# Patient Record
Sex: Female | Born: 1985 | Hispanic: Yes | Smoking: Former smoker
Health system: Southern US, Community
[De-identification: ages and names within clinical notes are randomized; demographics above are authoritative.]

## PROBLEM LIST (undated history)

## (undated) ENCOUNTER — Inpatient Hospital Stay (HOSPITAL_COMMUNITY): Payer: Self-pay

## (undated) DIAGNOSIS — E785 Hyperlipidemia, unspecified: Secondary | ICD-10-CM

## (undated) DIAGNOSIS — N159 Renal tubulo-interstitial disease, unspecified: Secondary | ICD-10-CM

## (undated) HISTORY — PX: NO PAST SURGERIES: SHX2092

---

## 2003-04-07 ENCOUNTER — Emergency Department (HOSPITAL_COMMUNITY): Admission: EM | Admit: 2003-04-07 | Discharge: 2003-04-07 | Payer: Self-pay | Admitting: Emergency Medicine

## 2004-03-09 ENCOUNTER — Inpatient Hospital Stay (HOSPITAL_COMMUNITY): Admission: AD | Admit: 2004-03-09 | Discharge: 2004-03-09 | Payer: Self-pay | Admitting: Obstetrics and Gynecology

## 2004-09-20 ENCOUNTER — Ambulatory Visit: Payer: Self-pay | Admitting: Obstetrics and Gynecology

## 2004-10-13 ENCOUNTER — Ambulatory Visit: Payer: Self-pay | Admitting: *Deleted

## 2005-02-09 ENCOUNTER — Ambulatory Visit: Payer: Self-pay | Admitting: *Deleted

## 2005-02-16 ENCOUNTER — Ambulatory Visit: Payer: Self-pay | Admitting: Obstetrics and Gynecology

## 2005-02-23 ENCOUNTER — Encounter: Payer: Self-pay | Admitting: Obstetrics and Gynecology

## 2005-02-23 ENCOUNTER — Ambulatory Visit: Payer: Self-pay | Admitting: Obstetrics and Gynecology

## 2005-02-28 ENCOUNTER — Inpatient Hospital Stay (HOSPITAL_COMMUNITY): Admission: AD | Admit: 2005-02-28 | Discharge: 2005-02-28 | Payer: Self-pay | Admitting: Obstetrics & Gynecology

## 2005-04-27 ENCOUNTER — Ambulatory Visit: Payer: Self-pay | Admitting: Obstetrics and Gynecology

## 2005-08-23 ENCOUNTER — Ambulatory Visit: Payer: Self-pay | Admitting: Family Medicine

## 2005-08-28 ENCOUNTER — Ambulatory Visit (HOSPITAL_COMMUNITY): Admission: RE | Admit: 2005-08-28 | Discharge: 2005-08-28 | Payer: Self-pay | Admitting: Family Medicine

## 2005-10-04 ENCOUNTER — Ambulatory Visit: Payer: Self-pay | Admitting: Gynecology

## 2009-02-09 ENCOUNTER — Inpatient Hospital Stay (HOSPITAL_COMMUNITY): Admission: AD | Admit: 2009-02-09 | Discharge: 2009-02-09 | Payer: Self-pay | Admitting: Obstetrics and Gynecology

## 2009-05-06 ENCOUNTER — Ambulatory Visit (HOSPITAL_COMMUNITY): Admission: RE | Admit: 2009-05-06 | Discharge: 2009-05-06 | Payer: Self-pay | Admitting: Family Medicine

## 2009-07-01 ENCOUNTER — Encounter: Payer: Self-pay | Admitting: Family Medicine

## 2009-07-01 ENCOUNTER — Ambulatory Visit (HOSPITAL_COMMUNITY): Admission: RE | Admit: 2009-07-01 | Discharge: 2009-07-01 | Payer: Self-pay | Admitting: Family Medicine

## 2009-09-19 ENCOUNTER — Ambulatory Visit: Payer: Self-pay | Admitting: Advanced Practice Midwife

## 2010-02-10 ENCOUNTER — Inpatient Hospital Stay (HOSPITAL_COMMUNITY): Admission: AD | Admit: 2010-02-10 | Discharge: 2009-09-20 | Payer: Self-pay | Admitting: Family Medicine

## 2010-03-27 ENCOUNTER — Encounter: Payer: Self-pay | Admitting: Family Medicine

## 2010-05-21 LAB — CBC
HCT: 34.5 % — ABNORMAL LOW (ref 36.0–46.0)
Hemoglobin: 11.4 g/dL — ABNORMAL LOW (ref 12.0–15.0)
MCV: 82.9 fL (ref 78.0–100.0)
Platelets: 227 10*3/uL (ref 150–400)
RBC: 4.16 MIL/uL (ref 3.87–5.11)
WBC: 12.4 10*3/uL — ABNORMAL HIGH (ref 4.0–10.5)

## 2010-06-07 LAB — URINE CULTURE: Colony Count: >=100000

## 2010-06-07 LAB — URINALYSIS, ROUTINE W REFLEX MICROSCOPIC
Specific Gravity, Urine: 1.015 (ref 1.005–1.030)
Urobilinogen, UA: 1 mg/dL (ref 0.0–1.0)

## 2010-06-07 LAB — CBC
Hemoglobin: 11.8 g/dL — ABNORMAL LOW (ref 12.0–15.0)
RBC: 3.7 MIL/uL — ABNORMAL LOW (ref 3.87–5.11)

## 2010-06-07 LAB — URINE MICROSCOPIC-ADD ON

## 2010-06-07 LAB — WET PREP, GENITAL

## 2010-06-07 LAB — GC/CHLAMYDIA PROBE AMP, GENITAL: GC Probe Amp, Genital: NEGATIVE

## 2010-07-22 NOTE — Group Therapy Note (Signed)
Laura [name], SANTARELLI NO.:  [id number]   MEDICAL RECORD NO.:  [id number]          PATIENT TYPE:  WOC   LOCATION:  [facility]                   FACILITY:  [facility]   PHYSICIAN:  Montey Hora, M.D.    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  The patient is here today for a follow up Pap smear.  She also complains of some lower abdominal pain and some mid epigastric  pain. The mid epigastric pain is worse with acid or fatty foods.  She has  not taken any medicine to try to alleviate it.  Her abdominal pain is mild  all over her abdomen, worse if she bends forward and stands back up although  brief, it is fairly intense.  Denies any other problems. She does have some  breast tenderness.   PAST MEDICAL HISTORY:  Unremarkable.   MEDICATIONS:  None.   ALLERGIES:  None.   PHYSICAL EXAMINATION:  VITAL SIGNS:  Temperature is 98.1, pulse is 68, blood  pressure is 109/64 and weight is 138.5.  GENERAL:  Well-developed, well-nourished, in no acute distress.  HEART:  Regular rate and rhythm without murmur.  LUNGS:  Clear to auscultation. Normal respiratory effort.  ABDOMEN:  Soft, bowel sounds are present, there is very mild tenderness to  palpation in bilateral lower quadrants, also in the left upper quadrant.  PELVIC EXAM:  Shows normal external genitalia, normal vaginal mucosa, normal  nulliparous cervix. Uterus feels mildly enlarged, a little bit of tenderness  over the left adnexa. Right adnexa is normal size and no tenderness.   Urine pregnancy test is negative.   ASSESSMENT AND PLAN:  1.  Abnormal Pap, S/P cryotherapy in August, 2006. Repeat Pap was done      today.  2.  Amenorrhea.  Urine pregnancy today is negative. Have asked her to   recheck in 2 weeks if no period by that time.  3.  Abdominal pain, mildly enlarged uterus. Will check a pelvic ultrasound.  4.  Gastroesophageal reflux disease. Offered patient Tums, Zantac or      Prilosec, all over-the-counter.   Desire for pregnancy - advised multivitamin. Follow up if no pregnancy  results within a year of the onset of trying for pregnancy.           ______________________________  Montey Hora, M.D.     KR/MEDQ  D:  08/23/2005  T:  08/23/2005  Job:  [id number]

## 2010-07-22 NOTE — Group Therapy Note (Signed)
Laura [name], LEUGERS NO.:  [id number]   MEDICAL RECORD NO.:  [id number]          PATIENT TYPE:  WOC   LOCATION:  [facility]                   FACILITY:  [facility]   PHYSICIAN:  Argentina Donovan, MD        DATE OF BIRTH:  [birth year]   DATE OF SERVICE:  09/20/2004                                    CLINIC NOTE   CHIEF COMPLAINT:  Told she had an abnormal Pap smear.   The patient is a 25 year old nulligravida Hispanic female who speaks some  English who has an insignificant past history until she had an abnormal Pap  smear. Colposcopy was done and showed CIN-2, severe dysplasia. I had a long  discussion with the patient including HPV as well as using condoms and the  procedures we would do. We have decided to do cryosurgery on this patient.  She will come back for another appointment.   ALLERGIES:  None.   REGULAR MEDICATIONS:  None.   FAMILY HISTORY:  Noncontributory.   REVIEW OF SYSTEMS:  Negative with the exception of present illness.   PHYSICAL EXAMINATION:  VITAL SIGNS:  Blood pressure 111/68, pulse 79 per  minute, temperature 98, respirations 18 per minute. The patient is 142  pounds and 5 feet 4 inches.  HEENT:  Within normal limits.  LUNGS:  Clear to auscultation and percussion.  HEART:  No murmur, normal sinus rhythm.  ABDOMEN:  Soft and nontender with no mass, no organomegaly.  PELVIC:  External genitalia normal, BUS within normal limits. The vagina is  clean and well-rugated. The cervix is clean and nulliparous. Uterus  anterior; normal size, shape, and consistency; adnexa normal.  EXTREMITIES:  Negative.   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 Scheduled cryosurgery.       PR/MEDQ  D:  09/20/2004  T:  09/20/2004  Job:  [id number]

## 2010-07-22 NOTE — Group Therapy Note (Signed)
[name], [name] NO.:  [id number]   MEDICAL RECORD NO.:  [id number]          PATIENT TYPE:  WOC   LOCATION:  [facility]                   FACILITY:  [facility]   PHYSICIAN:  Ginger Carne, MD DATE OF BIRTH:  July 04, 1985   DATE OF SERVICE:  10/04/2005                                    CLINIC NOTE   This is a followup visit over 25 year old nulliparous Hispanic female who  has irregular menses.  She was recently seen on August 23, 2005, by Dr. Dimple Casey,  and the reader is referred to this note.  The patient complains of a right  lateral breast discomfort without any palpable masses.  The pain has  occurred over the past several weeks.  Her discomfort that she describes in  the pelvis is no different than what she has experienced in the past. Pelvic  sonogram obtained recently August 28, 2005, was normal.   SALIENT PHYSICAL FINDINGS:  Both breasts examined without dominant masses,  discharge, thickenings or tenderness.  Supraclavicular nodes are negative.  Axilla negative.  Abdomen is soft without gross hepatosplenomegaly.   IMPRESSION:  1.  Irregular menses with occasional pelvic pain.  2.  Right breast pain.   PLAN:  The patient understands that, in order to control her menses with a  presumptive diagnosis of PCOS, that she would need to be on oral  contraceptives.  Since she is desirous for birth control and is unable to  afford fertility management, the patient was asked to think about what she  would like to do.   In view of no physical findings present on breast examination and her young  age and the patient's concurrence that she does not palpate a mass, she was  asked to observe this area for further pain. If it continues over the next 4-  6 weeks, I asked her to return, and we will proceed with a breast sonogram  and possible mammogram.           ______________________________  Ginger Carne, MD     SHB/MEDQ  D:  10/04/2005  T:  10/04/2005   Job:  [id number]

## 2010-07-22 NOTE — Group Therapy Note (Signed)
[name], [name] NO.:  [id number]   MEDICAL RECORD NO.:  [id number]          PATIENT TYPE:  WOC   LOCATION:  [facility]                   FACILITY:  [facility]   PHYSICIAN:  Argentina Donovan, MD        DATE OF BIRTH:  [birth year]   DATE OF SERVICE:                                    CLINIC NOTE   HISTORY OF PRESENT ILLNESS:  The patient is a 25 year old nulligravida  Hispanic female who speaks English, and was with a history of CIN 2 and  severe dysplasia who underwent cryosurgery, and has been followed with Pap  smears, her last being in December which was normal, and she is due for  another Pap smear in June.  Her chief complaint today was the onset of  abrupt periumbilical pain that radiated down into the pelvis starting 2  weeks prior to this visit with a last menstrual period being March 30, 2005.  This pain is steady.  It was abrupt in onset.  It is worse with  intercourse, and is accompanied by a bilateral frontal headache that has  lasted since the pain began.  Today, it seems to be getting better, and we  should be expecting her period at any time.  However, she has been having  unprotected sex, and interestingly enough, in her history, when she had the  cryosurgery, she was amenorrheic for 4 months after that and then had a  period where she bled for a month.   PHYSICAL EXAMINATION:  ABDOMEN:  Soft, flat, nontender.  No masses.  No  organomegaly.  Patient indicates the area of the pain, but she has no  guarding, rebound or obvious physical findings of pain.  GENITOURINARY:  The external genitalia is normal.  BUS within normal limits.  The vagina is clean.  The cervix is clean and nulliparous, and the uterus is  easily palpated anterior, and normal size, shape and consistency.  Both  adnexa could be easily palpated with normal pain to compression.   DISCUSSION:  The patient is anxious to use birth control.  We have told her  she may have had an episode of  mittelschmerz.  We are going to await the  onset of her next period, and see whether the headaches and the pain  resolves.  If it does not, further testing, i.e. ultrasound, may have to be  done.  The patient is being a given a prescription for Ortho Tri-Cyclen Lo  which she is going to start after the onset of her next period, and she is  to return if the pain and/or the headaches continue.   IMPRESSION:  Pelvic pain accompanied by continual headache since onset,  probable mittelschmerz, possible ruptured ovarian cyst.           ______________________________  Argentina Donovan, MD     PR/MEDQ  D:  04/27/2005  T:  04/27/2005  Job:  [id number]

## 2010-07-22 NOTE — Group Therapy Note (Signed)
NAMESELISA, TENSLEY NO.:  [id number]   MEDICAL RECORD NO.:  [id number]          PATIENT TYPE:  WOC   LOCATION[facility]                   FACILITY:  [facility]   PHYSICIAN:  Carolanne Grumbling, M.D.   DATE OF BIRTH:  Jul 03, 1985   DATE OF SERVICE:                                    CLINIC NOTE   PROCEDURE NOTE   PREOPERATIVE DIAGNOSIS:  Cervical dysplasia (CIN-2, severe dysplasia).   POSTOPERATIVE DIAGNOSIS:  Cervical dysplasia (CIN-2, severe dysplasia).   PROCEDURE:  Cryotherapy.   SURGEON:  Carolanne Grumbling, M.D.   ASSISTANT:  Argentina Donovan, M.D.   ESTIMATED BLOOD LOSS:  None.   ANESTHESIA:  None.   DESCRIPTION OF PROCEDURE:  The patient is a 25 year old nulligravida  Hispanic female, who has a history of CIN-2, severe dysplasia.  Please see  office note from 09/20/04 full details.   The patient was put in stirrups.  A speculum was placed with good  visualization of the cervix.  There was gel placed on the tip of the cryo  machine and it was placed on her cervix.  It was frozen for three minutes,  then allowed to have a thaw cycle of five minutes and then frozen again for  an additional three minutes.  The patient tolerated the procedure well and  there were no complications.   The patient is to return to clinic in four months for repeat Pap smear.  She  was counseled on the fact that she will have some foul smelling vaginal  discharge, but that should improve over the next week or two.  She is to  abstain for any thing per vagina x3 weeks.       TW/MEDQ  D:  10/13/2004  T:  10/14/2004  Job:  [id number]

## 2010-07-22 NOTE — Group Therapy Note (Signed)
NAMERYAH, CRIBB NO.:  [id number]   MEDICAL RECORD NO.:  [id number]          PATIENT TYPE:  WOC   LOCATION:  [facility]                   [facility]ACILITY:  [facility]   PHYSICIAN:  Argentina Donovan, MD        DATE OF BIRTH:  1985/11/13   DATE OF SERVICE:                                    CLINIC NOTE   The patient here with a chief complaint of follow-up Pap smear for cervical  dysplasia type 2.   The patient is a 25 year old nulligravida Hispanic female, who speaks some  English and has an insignificant past history until she had an abnormal Pap  smear.  Colposcopy was followed and done, which showed a CIN 2 and severe  dysplasia.  The patient was here today to have a follow-up Pap smear.   ALLERGIES:  NONE.   REGULAR MEDICATIONS:  None.   FAMILY HISTORY:  Noncontributory.   REVIEW OF SYSTEMS:  Negative with the exception of present illness.   PHYSICAL EXAMINATION:  VITAL SIGNS:  Pulse 86, blood pressure 108/71, weight  138.3, height 5 feet 2 inches.  HEENT:  Within normal limits.  LUNGS:  Clear to auscultation.  HEART:  No murmur, normal sinus rhythm.  ABDOMEN:  Soft, non-tender with no mass, no organomegaly.  PELVIC:  Pelvic exam and a Pap smear was done.  External genitalia was  normal.  BUS normal within limits.  The vagina was clean, well rugated, no  lesions, no excoriations.  Cervix was clean, nulliparous.  Uterus anterior  on bimanual exam.  Adnexa were not palpated.  Pap smear was done and samples  were sent for follow-up on cervical dysplasia.   PLAN:  Follow-up on the results.           ______________________________  Argentina Donovan, MD     PR/MEDQ  D:  02/23/2005  T:  02/27/2005  Job:  [phone] [id number]

## 2011-02-24 ENCOUNTER — Emergency Department (HOSPITAL_BASED_OUTPATIENT_CLINIC_OR_DEPARTMENT_OTHER)
Admission: EM | Admit: 2011-02-24 | Discharge: 2011-02-24 | Disposition: A | Discharge status: Home / Self Care | Payer: Worker's Compensation | Attending: Emergency Medicine | Admitting: Emergency Medicine

## 2011-02-24 DIAGNOSIS — W261XXA Contact with sword or dagger, initial encounter: Secondary | ICD-10-CM | POA: Insufficient documentation

## 2011-02-24 DIAGNOSIS — Y9229 Other specified public building as the place of occurrence of the external cause: Secondary | ICD-10-CM | POA: Insufficient documentation

## 2011-02-24 DIAGNOSIS — Y93G1 Activity, food preparation and clean up: Secondary | ICD-10-CM | POA: Insufficient documentation

## 2011-02-24 DIAGNOSIS — S6992XA Unspecified injury of left wrist, hand and finger(s), initial encounter: Secondary | ICD-10-CM

## 2011-02-24 DIAGNOSIS — S61209A Unspecified open wound of unspecified finger without damage to nail, initial encounter: Secondary | ICD-10-CM | POA: Insufficient documentation

## 2011-02-24 DIAGNOSIS — W260XXA Contact with knife, initial encounter: Secondary | ICD-10-CM | POA: Insufficient documentation

## 2011-02-24 MED ORDER — TETANUS-DIPHTH-ACELL PERTUSSIS 5-2.5-18.5 LF-MCG/0.5 IM SUSP
0.5000 mL | Freq: Once | INTRAMUSCULAR | Status: AC
  Administered 2011-02-24: 0.5 mL via INTRAMUSCULAR
  Filled 2011-02-24: qty 0.5

## 2011-02-24 MED ORDER — BENZOCAINE 20 % MT SOLN
OROMUCOSAL | Status: AC
  Filled 2011-02-24: qty 57

## 2011-02-24 NOTE — ED Provider Notes (Signed)
History     CSN: [id number]  Arrival date & time 02/24/11  2034   First MD Initiated Contact with Patient 02/24/11 2200      Chief Complaint  Patient presents with  . Laceration    left thumb    (Consider location/radiation/quality/duration/timing/severity/associated sxs/prior treatment) HPI 25 year old otherwise healthy female presents with wound to left thumb while at work. 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 The knife did not penetrate through the glove. She does not know when her last tetanus shot was.  History reviewed. No pertinent past medical history.  History reviewed. No pertinent past surgical history.  History reviewed. No pertinent family history.  History  Substance Use Topics  . Smoking status: Current Some Day Smoker    Types: Cigarettes  . Smokeless tobacco: Never Used  . Alcohol Use: No    OB History    Grav Para Term Preterm Abortions TAB SAB Ect Mult Living                  Review of Systems  Allergies  Review of patient's allergies indicates no known allergies.  Home Medications  No current outpatient prescriptions on file.  BP 111/74  Pulse 81  Temp(Src) 98.2 F (36.8 C) (Oral)  Resp 16  Ht 5\' 2"  (1.575 m)  Wt 150 lb (68.04 kg)  BMI 27.44 kg/m2  SpO2 96%  LMP 02/19/2011  Physical Exam Gen: NAD, young, well nourished L thumb: disruption of the distal 3mm of L thumbnail from lateral to medial with ~20% of nail still attached.  No visible laceration to thumb tissue.  Good ROM, full strength, good capillary refill.  No foreign body identified. ED Course  Procedures (including critical care time)  Thumb was irrigated with copious warm water.  Dermabond was applied to nail disruption.  Labs Reviewed - No data to display No results found.   No diagnosis found.    MDM  25 year old who sustained an injury to the left thumb while  cutting chicken at work. There is breakage of the distal 3 mm of the thumbnail, with no visible laceration to the thumb itself. The majority of the distal thumbnail has been separated from the nailbed with resultant bleeding, but the medial portion of the nail remains attached, keeping the dislodged portion in relative anatomic position. The thumb itself appears functionally intact. The wound was irrigated, examined for foreign body, and then dermabond was applied. The patient received a tetanus shot. And can return to work normally, as she is right-handed. She can take ibuprofen for pain relief.        Kathreen Cosier, MD 02/24/11 2212

## 2011-02-24 NOTE — ED Notes (Addendum)
Pt states that she cut her left thumb while slicing chicken tonight at work.  Pt states that she does not know when her last tetanus shot was, unsure about need for UDS.  Occurred at work, works at [employer].  Wound clotted, no bleeding at this time.  In nad

## 2011-02-25 NOTE — ED Provider Notes (Signed)
I saw and evaluated the patient, reviewed the resident's note and I agree with the findings and plan.  The patient has a small injury to her distal nail without injury to the distal finger.  Dermabond applied to keep the nail down although there may be a very small injury to the underlying nail bed this would be very insignificant and is not necessary to remove the nail just to evaluate.  I suspect that will cause more bleeding and pain, and won't lead to improvement in evaluation or symptoms  Lyanne Co, MD 02/25/11 0100

## 2011-06-25 ENCOUNTER — Emergency Department (HOSPITAL_BASED_OUTPATIENT_CLINIC_OR_DEPARTMENT_OTHER)
Admission: EM | Admit: 2011-06-25 | Discharge: 2011-06-25 | Disposition: A | Discharge status: Home / Self Care | Payer: Self-pay | Attending: Emergency Medicine | Admitting: Emergency Medicine

## 2011-06-25 ENCOUNTER — Encounter (HOSPITAL_BASED_OUTPATIENT_CLINIC_OR_DEPARTMENT_OTHER): Payer: Self-pay | Admitting: Emergency Medicine

## 2011-06-25 DIAGNOSIS — R1013 Epigastric pain: Secondary | ICD-10-CM | POA: Insufficient documentation

## 2011-06-25 DIAGNOSIS — F17200 Nicotine dependence, unspecified, uncomplicated: Secondary | ICD-10-CM | POA: Insufficient documentation

## 2011-06-25 DIAGNOSIS — K2970 Gastritis, unspecified, without bleeding: Secondary | ICD-10-CM | POA: Insufficient documentation

## 2011-06-25 DIAGNOSIS — R110 Nausea: Secondary | ICD-10-CM | POA: Insufficient documentation

## 2011-06-25 DIAGNOSIS — K219 Gastro-esophageal reflux disease without esophagitis: Secondary | ICD-10-CM | POA: Insufficient documentation

## 2011-06-25 LAB — DIFFERENTIAL
Eosinophils Absolute: 0.1 10*3/uL (ref 0.0–0.7)
Eosinophils Relative: 1 % (ref 0–5)
Lymphocytes Relative: 36 % (ref 12–46)
Lymphs Abs: 3.4 10*3/uL (ref 0.7–4.0)
Monocytes Relative: 5 % (ref 3–12)

## 2011-06-25 LAB — URINALYSIS, ROUTINE W REFLEX MICROSCOPIC
Bilirubin Urine: NEGATIVE
Ketones, ur: NEGATIVE mg/dL
Nitrite: NEGATIVE
Urobilinogen, UA: 0.2 mg/dL (ref 0.0–1.0)

## 2011-06-25 LAB — CBC
Hemoglobin: 13.4 g/dL (ref 12.0–15.0)
MCH: 32.1 pg (ref 26.0–34.0)
MCV: 90.2 fL (ref 78.0–100.0)
Platelets: 233 10*3/uL (ref 150–400)
RBC: 4.18 MIL/uL (ref 3.87–5.11)
WBC: 9.6 10*3/uL (ref 4.0–10.5)

## 2011-06-25 LAB — COMPREHENSIVE METABOLIC PANEL
ALT: 13 U/L (ref 0–35)
Alkaline Phosphatase: 58 U/L (ref 39–117)
BUN: 14 mg/dL (ref 6–23)
CO2: 25 mEq/L (ref 19–32)
Calcium: 9.6 mg/dL (ref 8.4–10.5)
GFR calc Af Amer: >90 mL/min (ref >90)
GFR calc non Af Amer: >90 mL/min (ref >90)
Glucose, Bld: 90 mg/dL (ref 70–99)
Potassium: 3.8 mEq/L (ref 3.5–5.1)
Sodium: 138 mEq/L (ref 135–145)

## 2011-06-25 LAB — LIPASE, BLOOD: Lipase: 55 U/L (ref 11–59)

## 2011-06-25 MED ORDER — PANTOPRAZOLE SODIUM 40 MG PO TBEC
40.0000 mg | DELAYED_RELEASE_TABLET | Freq: Once | ORAL | Status: AC
  Administered 2011-06-25: 40 mg via ORAL
  Filled 2011-06-25: qty 1

## 2011-06-25 MED ORDER — ONDANSETRON HCL 4 MG PO TABS: 4.0000 mg | ORAL_TABLET | Freq: Four times a day (QID) | ORAL | Status: AC

## 2011-06-25 MED ORDER — GI COCKTAIL ~~LOC~~
30.0000 mL | Freq: Once | ORAL | Status: AC
  Administered 2011-06-25: 30 mL via ORAL
  Filled 2011-06-25: qty 30

## 2011-06-25 MED ORDER — LANSOPRAZOLE 30 MG PO CPDR: 30.0000 mg | DELAYED_RELEASE_CAPSULE | Freq: Every day | ORAL | Status: DC

## 2011-06-25 MED ORDER — ONDANSETRON 4 MG PO TBDP
4.0000 mg | ORAL_TABLET | Freq: Once | ORAL | Status: AC
  Administered 2011-06-25: 4 mg via ORAL
  Filled 2011-06-25: qty 1

## 2011-06-25 NOTE — Discharge Instructions (Signed)
Diet for GERD or PUD Nutrition therapy can help ease the discomfort of gastroesophageal reflux disease (GERD) and peptic ulcer disease (PUD).  HOME CARE INSTRUCTIONS   Eat your meals slowly, in a relaxed setting.   Eat 5 to 6 small meals per day.   If a food causes distress, stop eating it for a period of time.  FOODS TO AVOID  Coffee, regular or decaffeinated.   Cola beverages, regular or low calorie.   Tea, regular or decaffeinated.   Pepper.   Cocoa.   High fat foods, including meats.   Butter, margarine, hydrogenated oil (trans fats).   Peppermint or spearmint (if you have GERD).   Fruits and vegetables if not tolerated.   Alcohol.   Nicotine (smoking or chewing). This is one of the most potent stimulants to acid production in the gastrointestinal tract.   Any food that seems to aggravate your condition.  If you have questions regarding your diet, ask your caregiver or a registered dietitian. TIPS  Lying flat may make symptoms worse. Keep the head of your bed raised 6 to 9 inches (15 to 23 cm) by using a foam wedge or blocks under the legs of the bed.   Do not lay down until 3 hours after eating a meal.   Daily physical activity may help reduce symptoms.  MAKE SURE YOU:   Understand these instructions.   Will watch your condition.   Will get help right away if you are not doing well or get worse.  Document Released: 02/20/2005 Document Revised: 02/09/2011 Document Reviewed: 01/06/2011 [employer] Patient Information 2012 [employer]. 

## 2011-06-25 NOTE — ED Notes (Signed)
Pt c/o burning in epigastric area since 0300 today- has had several similar episodes in the past; has not tried OTC meds

## 2011-06-25 NOTE — ED Provider Notes (Signed)
History     CSN: [id number]  Arrival date & time 06/25/11  4098   First MD Initiated Contact with Patient 06/25/11 [phone]      Chief Complaint  Patient presents with  . Abdominal Pain    (Consider location/radiation/quality/duration/timing/severity/associated sxs/prior treatment) HPI Pt with burning epigastric pain radiating up to chest worse when lying flat starting last night around 3 am. + nausea but no vomiting. No melena. No fever/chills, urinary symptoms. Has had several episodes in the past. No prev abd surgeries. No current use of NSAID's History reviewed. No pertinent past medical history.  History reviewed. No pertinent past surgical history.  No family history on file.  History  Substance Use Topics  . Smoking status: Current Some Day Smoker  . Smokeless tobacco: Not on file  . Alcohol Use: No    OB History    Grav Para Term Preterm Abortions TAB SAB Ect Mult Living                  Review of Systems  Constitutional: Negative for fever and chills.  Respiratory: Negative for shortness of breath.   Cardiovascular: Negative for chest pain.  Gastrointestinal: Positive for nausea and abdominal pain. Negative for diarrhea and blood in stool.  Genitourinary: Negative for dysuria.  Skin: Negative for rash.    Allergies  Review of patient's allergies indicates no known allergies.  Home Medications  No current outpatient prescriptions on file.  BP 112/59  Pulse 75  Temp(Src) 97.5 F (36.4 C) (Oral)  Resp 16  Ht 5\' 2"  (1.575 m)  Wt 145 lb (65.772 kg)  BMI 26.52 kg/m2  SpO2 100%  LMP 05/23/2011  Physical Exam  Nursing note and vitals reviewed. Constitutional: She is oriented to person, place, and time. She appears well-developed and well-nourished. No distress.  HENT:  Head: Normocephalic and atraumatic.  Mouth/Throat: Oropharynx is clear and moist.  Eyes: EOM are normal. Pupils are equal, round, and reactive to light.  Neck: Normal range of motion.  Neck supple.  Cardiovascular: Normal rate and regular rhythm.   Pulmonary/Chest: Effort normal and breath sounds normal. No respiratory distress. She has no wheezes. She has no rales. She exhibits no tenderness.  Abdominal: Soft. Bowel sounds are normal. She exhibits no mass. There is tenderness (epigastric ttp, no rebound or guarding). There is no rebound and no guarding.  Musculoskeletal: Normal range of motion. She exhibits no edema and no tenderness.  Neurological: She is alert and oriented to person, place, and time.  Skin: Skin is warm and dry. No rash noted. No erythema.  Psychiatric: She has a normal mood and affect. Her behavior is normal.    ED Course  Procedures (including critical care time)  Labs Reviewed  URINALYSIS, ROUTINE W REFLEX MICROSCOPIC - Abnormal; Notable for the following:    Hgb urine dipstick MODERATE (*)    All other components within normal limits  URINE MICROSCOPIC-ADD ON - Abnormal; Notable for the following:    Squamous Epithelial / LPF FEW (*)    All other components within normal limits  PREGNANCY, URINE  CBC  DIFFERENTIAL  COMPREHENSIVE METABOLIC PANEL  LIPASE, BLOOD   No results found.   No diagnosis found.    MDM  Clinically present with gastritis. Will treat and check basic labs in ED.    Pt states she is feeling much better. Will d/c home with Rx for PPI, zofran. Encouraged to eat earlier in the evening, sleep with several pillows and avoid certain foods.  Pt will be given GI f/u for persistent symptoms. Return for any concerns  Loren Racer, MD 06/25/11 1553

## 2012-03-29 ENCOUNTER — Emergency Department (HOSPITAL_BASED_OUTPATIENT_CLINIC_OR_DEPARTMENT_OTHER)
Admission: EM | Admit: 2012-03-29 | Discharge: 2012-03-29 | Disposition: A | Discharge status: Home / Self Care | Payer: Self-pay | Attending: Emergency Medicine | Admitting: Emergency Medicine

## 2012-03-29 ENCOUNTER — Encounter (HOSPITAL_BASED_OUTPATIENT_CLINIC_OR_DEPARTMENT_OTHER): Payer: Self-pay | Admitting: *Deleted

## 2012-03-29 DIAGNOSIS — K649 Unspecified hemorrhoids: Secondary | ICD-10-CM | POA: Insufficient documentation

## 2012-03-29 DIAGNOSIS — F17200 Nicotine dependence, unspecified, uncomplicated: Secondary | ICD-10-CM | POA: Insufficient documentation

## 2012-03-29 MED ORDER — HYDROCODONE-ACETAMINOPHEN 5-325 MG PO TABS: 1.0000 | ORAL_TABLET | ORAL | Status: AC | PRN

## 2012-03-29 MED ORDER — HYDROCORTISONE ACETATE 25 MG RE SUPP: 25.0000 mg | Freq: Two times a day (BID) | RECTAL | Status: DC

## 2012-03-29 MED ORDER — DOCUSATE SODIUM 100 MG PO CAPS: 100.0000 mg | ORAL_CAPSULE | Freq: Two times a day (BID) | ORAL | Status: DC

## 2012-03-29 NOTE — ED Notes (Addendum)
MD at bedside. Rectal exam performed with hemocult collected.  Witness present.  Tolerated well.

## 2012-03-29 NOTE — ED Notes (Signed)
Pt c/o rectal bleeding and pain x 2 days

## 2012-03-29 NOTE — ED Provider Notes (Signed)
History     CSN: [id number]  Arrival date & time 03/29/12  1723   First MD Initiated Contact with Patient 03/29/12 1737      Chief Complaint  Patient presents with  . Rectal Bleeding    (Consider location/radiation/quality/duration/timing/severity/associated sxs/prior treatment) Patient is a 27 y.o. female presenting with hematochezia. The history is provided by the patient. No language interpreter was used.  Rectal Bleeding  The current episode started yesterday (A hard bowel movement yesterday and since then has been having rectal bleeding. On her last to the bathroom she passed a clot. She therefore sought evaluation.). The problem occurs occasionally. The problem has been unchanged. The patient is experiencing no pain. The stool is described as hard. Treatments tried: She has had no prior treatment for this. Been no previous episode. Pertinent negatives include no fever, no abdominal pain, no diarrhea, no hematemesis, no nausea, no vomiting, no hematuria, no vaginal bleeding and no vaginal discharge. She has been eating and drinking normally. She has received no recent medical care.    History reviewed. No pertinent past medical history.  History reviewed. No pertinent past surgical history.  History reviewed. No pertinent family history.  History  Substance Use Topics  . Smoking status: Current Some Day Smoker  . Smokeless tobacco: Not on file  . Alcohol Use: No    OB History    Grav Para Term Preterm Abortions TAB SAB Ect Mult Living                  Review of Systems  Constitutional: Negative for fever.  HENT: Negative.   Respiratory: Negative.   Cardiovascular: Negative.   Gastrointestinal: Positive for hematochezia and anal bleeding. Negative for nausea, vomiting, abdominal pain, diarrhea and hematemesis.  Genitourinary: Negative for hematuria, vaginal bleeding and vaginal discharge.  Musculoskeletal: Negative.   Skin: Negative.   Neurological: Negative.     Psychiatric/Behavioral: Negative.     Allergies  Review of patient's allergies indicates no known allergies.  Home Medications   Current Outpatient Rx  Name  Route  Sig  Dispense  Refill  . DOCUSATE SODIUM 100 MG PO CAPS   Oral   Take 1 capsule (100 mg total) by mouth every 12 (twelve) hours.   20 capsule   0   . HYDROCODONE-ACETAMINOPHEN 5-325 MG PO TABS   Oral   Take 1 tablet by mouth every 4 (four) hours as needed for pain.   12 tablet   0   . HYDROCORTISONE ACETATE 25 MG RE SUPP   Rectal   Place 1 suppository (25 mg total) rectally 2 (two) times daily.   12 suppository   0     BP 112/64  Pulse 79  Temp 97.7 F (36.5 C) (Oral)  Resp 16  Ht 5\' 2"  (1.575 m)  Wt 155 lb (70.308 kg)  BMI 28.35 kg/m2  SpO2 99%  LMP 03/15/2012  Physical Exam  Nursing note and vitals reviewed. Constitutional: She is oriented to person, place, and time. She appears well-developed and well-nourished. No distress.  HENT:  Head: Normocephalic and atraumatic.  Right Ear: External ear normal.  Left Ear: External ear normal.  Mouth/Throat: Oropharynx is clear and moist.  Eyes: Conjunctivae normal and EOM are normal. Pupils are equal, round, and reactive to light.  Neck: Normal range of motion. Neck supple.  Cardiovascular: Normal rate, regular rhythm and normal heart sounds.   Pulmonary/Chest: Effort normal and breath sounds normal.  Abdominal: Soft. Bowel sounds are normal. She  exhibits no distension. There is no tenderness.  Genitourinary:       She has tenderness on digital rectal exam, but no mass. The stool is mucoid streaks of blood.  Musculoskeletal: Normal range of motion.  Neurological: She is alert and oriented to person, place, and time.       No sensory or motor deficit.  Skin: Skin is warm and dry.  Psychiatric: She has a normal mood and affect. Her behavior is normal.    ED Course  Procedures (including critical care time)  Patient was advised to use Anusol-HC  suppository bid. She can take Colace 100 mg twice a day to soften her stools, and can take hydrocodone acetaminophen every 4 hours as needed for pain. If bleeding persists, she will need evaluation by a gastroenterologist with lower endoscopy. She was referred to Dr. Danise Edge if that is necessary.    1. Bleeding hemorrhoids            Carleene Cooper III, MD 03/29/12 1806

## 2013-01-28 ENCOUNTER — Emergency Department (HOSPITAL_BASED_OUTPATIENT_CLINIC_OR_DEPARTMENT_OTHER): Payer: No Typology Code available for payment source

## 2013-01-28 ENCOUNTER — Emergency Department (HOSPITAL_BASED_OUTPATIENT_CLINIC_OR_DEPARTMENT_OTHER)
Admission: EM | Admit: 2013-01-28 | Discharge: 2013-01-28 | Disposition: A | Discharge status: Home / Self Care | Payer: Self-pay | Attending: Emergency Medicine | Admitting: Emergency Medicine

## 2013-01-28 ENCOUNTER — Encounter (HOSPITAL_BASED_OUTPATIENT_CLINIC_OR_DEPARTMENT_OTHER): Payer: Self-pay | Admitting: Emergency Medicine

## 2013-01-28 DIAGNOSIS — Z3202 Encounter for pregnancy test, result negative: Secondary | ICD-10-CM | POA: Insufficient documentation

## 2013-01-28 DIAGNOSIS — S0993XA Unspecified injury of face, initial encounter: Secondary | ICD-10-CM | POA: Insufficient documentation

## 2013-01-28 DIAGNOSIS — Z87891 Personal history of nicotine dependence: Secondary | ICD-10-CM | POA: Insufficient documentation

## 2013-01-28 DIAGNOSIS — Y92410 Unspecified street and highway as the place of occurrence of the external cause: Secondary | ICD-10-CM | POA: Insufficient documentation

## 2013-01-28 DIAGNOSIS — Z87448 Personal history of other diseases of urinary system: Secondary | ICD-10-CM | POA: Insufficient documentation

## 2013-01-28 DIAGNOSIS — Y9389 Activity, other specified: Secondary | ICD-10-CM | POA: Insufficient documentation

## 2013-01-28 DIAGNOSIS — S3981XA Other specified injuries of abdomen, initial encounter: Secondary | ICD-10-CM | POA: Insufficient documentation

## 2013-01-28 DIAGNOSIS — M542 Cervicalgia: Secondary | ICD-10-CM

## 2013-01-28 DIAGNOSIS — IMO0002 Reserved for concepts with insufficient information to code with codable children: Secondary | ICD-10-CM | POA: Insufficient documentation

## 2013-01-28 DIAGNOSIS — S0990XA Unspecified injury of head, initial encounter: Secondary | ICD-10-CM | POA: Insufficient documentation

## 2013-01-28 HISTORY — DX: Renal tubulo-interstitial disease, unspecified: N15.9

## 2013-01-28 LAB — POCT I-STAT, CHEM 8
Calcium, Ion: 1.2 mmol/L (ref 1.12–1.23)
Creatinine, Ser: 0.8 mg/dL (ref 0.50–1.10)
Glucose, Bld: 100 mg/dL — ABNORMAL HIGH (ref 70–99)
HCT: 40 % (ref 36.0–46.0)
Hemoglobin: 13.6 g/dL (ref 12.0–15.0)
TCO2: 24 mmol/L (ref 0–100)

## 2013-01-28 MED ORDER — DIAZEPAM 5 MG PO TABS: 5.0000 mg | ORAL_TABLET | Freq: Four times a day (QID) | ORAL | Status: DC | PRN

## 2013-01-28 MED ORDER — IOHEXOL 300 MG/ML  SOLN
100.0000 mL | Freq: Once | INTRAMUSCULAR | Status: AC | PRN
  Administered 2013-01-28: 100 mL via INTRAVENOUS

## 2013-01-28 MED ORDER — HYDROCODONE-ACETAMINOPHEN 5-325 MG PO TABS: 1.0000 | ORAL_TABLET | Freq: Four times a day (QID) | ORAL | Status: DC | PRN
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
## 2014-01-13 ENCOUNTER — Emergency Department (HOSPITAL_BASED_OUTPATIENT_CLINIC_OR_DEPARTMENT_OTHER)
Admission: EM | Admit: 2014-01-13 | Discharge: 2014-01-13 | Disposition: A | Discharge status: Home / Self Care | Payer: No Typology Code available for payment source | Attending: Emergency Medicine | Admitting: Emergency Medicine

## 2014-01-13 ENCOUNTER — Encounter (HOSPITAL_BASED_OUTPATIENT_CLINIC_OR_DEPARTMENT_OTHER): Payer: Self-pay | Admitting: *Deleted

## 2014-01-13 DIAGNOSIS — H6091 Unspecified otitis externa, right ear: Secondary | ICD-10-CM

## 2014-01-13 DIAGNOSIS — Z79899 Other long term (current) drug therapy: Secondary | ICD-10-CM | POA: Insufficient documentation

## 2014-01-13 DIAGNOSIS — Z7952 Long term (current) use of systemic steroids: Secondary | ICD-10-CM | POA: Insufficient documentation

## 2014-01-13 DIAGNOSIS — Z87448 Personal history of other diseases of urinary system: Secondary | ICD-10-CM | POA: Insufficient documentation

## 2014-01-13 DIAGNOSIS — Z87891 Personal history of nicotine dependence: Secondary | ICD-10-CM | POA: Insufficient documentation

## 2014-01-13 MED ORDER — CIPROFLOXACIN-DEXAMETHASONE 0.3-0.1 % OT SUSP
4.0000 [drp] | Freq: Once | OTIC | Status: AC
  Administered 2014-01-13: 4 [drp] via OTIC
  Filled 2014-01-13: qty 7.5

## 2014-01-13 MED ORDER — ANTIPYRINE-BENZOCAINE 5.4-1.4 % OT SOLN
3.0000 [drp] | Freq: Once | OTIC | Status: AC
  Administered 2014-01-13: 4 [drp] via OTIC
  Filled 2014-01-13: qty 10

## 2014-01-13 NOTE — ED Notes (Signed)
Ear pain for a week.

## 2014-01-13 NOTE — ED Provider Notes (Signed)
CSN: [id number]     Arrival date & time 01/13/14  1708 History   First MD Initiated Contact with Patient 01/13/14 1714     Chief Complaint  Patient presents with  . Otalgia     (Consider location/radiation/quality/duration/timing/severity/associated sxs/prior Treatment) HPI Comments: This is a 28 year old female who presents to the emergency department complaining of right ear pain 1 week. Patient reports it feels like there is fluid in her ear. No aggravating or alleviating factors. No drainage. She admits to using Q-tips occasionally into her ear. Denies fevers.  Patient is a 28 y.o. female presenting with ear pain. The history is provided by the patient.  Otalgia   Past Medical History  Diagnosis Date  . Kidney infection    History reviewed. No pertinent past surgical history. No family history on file. History  Substance Use Topics  . Smoking status: Former Games developermoker  . Smokeless tobacco: Not on file  . Alcohol Use: No   OB History    No data available     Review of Systems  Constitutional: Negative.   HENT: Positive for ear pain.   Eyes: Negative.   Respiratory: Negative.   Cardiovascular: Negative.   Gastrointestinal: Negative.   Neurological: Negative.       Allergies  Review of patient's allergies indicates no known allergies.  Home Medications   Prior to Admission medications   Medication Sig Start Date End Date Taking? Authorizing Provider  diazepam (VALIUM) 5 MG tablet Take 1 tablet (5 mg total) by mouth every 6 (six) hours as needed for muscle spasms (spasms). 01/28/13   Elwin MochaBlair Walden, MD  docusate sodium (COLACE) 100 MG capsule Take 1 capsule (100 mg total) by mouth every 12 (twelve) hours. 03/29/12   Carleene CooperAlan Davidson, MD  HYDROcodone-acetaminophen (NORCO/VICODIN) 5-325 MG per tablet Take 1 tablet by mouth every 6 (six) hours as needed. 01/28/13   Elwin MochaBlair Walden, MD  hydrocortisone (ANUSOL-HC) 25 MG suppository Place 1 suppository (25 mg total) rectally 2  (two) times daily. 03/29/12   Carleene CooperAlan Davidson, MD   BP 104/66 mmHg  Pulse 74  Temp(Src) 98.3 F (36.8 C) (Oral)  Resp 18  Ht 5\' 2"  (1.575 m)  Wt 150 lb (68.04 kg)  BMI 27.43 kg/m2  SpO2 99% Physical Exam  Constitutional: She is oriented to person, place, and time. She appears well-developed and well-nourished. No distress.  HENT:  Head: Normocephalic and atraumatic.  Mouth/Throat: Oropharynx is clear and moist.  R ear canal erythematous and inflamed. Tympanic membrane normal. No drainage. No mastoid tenderness.  Eyes: Conjunctivae and EOM are normal.  Neck: Normal range of motion. Neck supple.  Cardiovascular: Normal rate, regular rhythm and normal heart sounds.   Pulmonary/Chest: Effort normal and breath sounds normal. No respiratory distress.  Musculoskeletal: Normal range of motion. She exhibits no edema.  Neurological: She is alert and oriented to person, place, and time. No sensory deficit.  Skin: Skin is warm and dry.  Psychiatric: She has a normal mood and affect. Her behavior is normal.  Nursing note and vitals reviewed.   ED Course  Procedures (including critical care time) Labs Review Labs Reviewed - No data to display  Imaging Review No results found.   EKG Interpretation None      MDM   Final diagnoses:  Otitis externa, right   Patient well-appearing and in no apparent distress. Vital signs stable. Afebrile. Treat with Ciprodex and Auralgan. Advised against Q-tip use. Stable for discharge. Return precautions given. Patient states understanding of treatment  care plan and is agreeable.  Kathrynn SpeedRobyn M Zalia Hautala, PA-C 01/13/14 1751  Enid SkeensJoshua M Zavitz, MD 01/14/14 [phone]

## 2014-01-13 NOTE — Discharge Instructions (Signed)
Use ear drips cipro-dex four drops into the right ear twice daily for 7 days. Do not use Q-tips in your ears.  Otitis Externa Otitis externa is a bacterial or fungal infection of the outer ear canal. This is the area from the eardrum to the outside of the ear. Otitis externa is sometimes called "swimmer's ear." CAUSES  Possible causes of infection include:  Swimming in dirty water.  Moisture remaining in the ear after swimming or bathing.  Mild injury (trauma) to the ear.  Objects stuck in the ear (foreign body).  Cuts or scrapes (abrasions) on the outside of the ear. SIGNS AND SYMPTOMS  The first symptom of infection is often itching in the ear canal. Later signs and symptoms may include swelling and redness of the ear canal, ear pain, and yellowish-white fluid (pus) coming from the ear. The ear pain may be worse when pulling on the earlobe. DIAGNOSIS  Your health care provider will perform a physical exam. A sample of fluid may be taken from the ear and examined for bacteria or fungi. TREATMENT  Antibiotic ear drops are often given for 10 to 14 days. Treatment may also include pain medicine or corticosteroids to reduce itching and swelling. HOME CARE INSTRUCTIONS   Apply antibiotic ear drops to the ear canal as prescribed by your health care provider.  Take medicines only as directed by your health care provider.  If you have diabetes, follow any additional treatment instructions from your health care provider.  Keep all follow-up visits as directed by your health care provider. PREVENTION   Keep your ear dry. Use the corner of a towel to absorb water out of the ear canal after swimming or bathing.  Avoid scratching or putting objects inside your ear. This can damage the ear canal or remove the protective wax that lines the canal. This makes it easier for bacteria and fungi to grow.  Avoid swimming in lakes, polluted water, or poorly chlorinated pools.  You may use ear drops  made of rubbing alcohol and vinegar after swimming. Combine equal parts of white vinegar and alcohol in a bottle. Put 3 or 4 drops into each ear after swimming. SEEK MEDICAL CARE IF:   You have a fever.  Your ear is still red, swollen, painful, or draining pus after 3 days.  Your redness, swelling, or pain gets worse.  You have a severe headache.  You have redness, swelling, pain, or tenderness in the area behind your ear. MAKE SURE YOU:   Understand these instructions.  Will watch your condition.  Will get help right away if you are not doing well or get worse. Document Released: 02/20/2005 Document Revised: 07/07/2013 Document Reviewed: 03/09/2011 [facility] Patient Information 2015 [employer]. This information is not intended to replace advice given to you by your health care provider. Make sure you discuss any questions you have with your health care provider.

## 2014-01-15 ENCOUNTER — Encounter (HOSPITAL_BASED_OUTPATIENT_CLINIC_OR_DEPARTMENT_OTHER): Payer: Self-pay | Admitting: *Deleted

## 2014-01-15 ENCOUNTER — Emergency Department (HOSPITAL_BASED_OUTPATIENT_CLINIC_OR_DEPARTMENT_OTHER)
Admission: EM | Admit: 2014-01-15 | Discharge: 2014-01-15 | Disposition: A | Discharge status: Home / Self Care | Payer: No Typology Code available for payment source | Attending: Emergency Medicine | Admitting: Emergency Medicine

## 2014-01-15 DIAGNOSIS — Z87448 Personal history of other diseases of urinary system: Secondary | ICD-10-CM | POA: Insufficient documentation

## 2014-01-15 DIAGNOSIS — Z79899 Other long term (current) drug therapy: Secondary | ICD-10-CM | POA: Insufficient documentation

## 2014-01-15 DIAGNOSIS — Z87891 Personal history of nicotine dependence: Secondary | ICD-10-CM | POA: Insufficient documentation

## 2014-01-15 DIAGNOSIS — H6093 Unspecified otitis externa, bilateral: Secondary | ICD-10-CM | POA: Insufficient documentation

## 2014-01-15 MED ORDER — NEOMYCIN-POLYMYXIN-HC 3.5-10000-1 OT SUSP: 4.0000 [drp] | Freq: Three times a day (TID) | OTIC | Status: DC

## 2014-01-15 MED ORDER — NAPROXEN 500 MG PO TABS: 500.0000 mg | ORAL_TABLET | Freq: Two times a day (BID) | ORAL | Status: DC

## 2014-01-15 MED ORDER — NAPROXEN 250 MG PO TABS
500.0000 mg | ORAL_TABLET | Freq: Once | ORAL | Status: AC
  Administered 2014-01-15: 500 mg via ORAL
  Filled 2014-01-15: qty 2

## 2014-01-15 NOTE — ED Provider Notes (Signed)
CSN: [id number]     Arrival date & time 01/15/14  1915 History   First MD Initiated Contact with Patient 01/15/14 2144     This chart was scribed for Laura DibblesJon Oliviagrace Crisanti, MD by Laura Wood, ED Scribe. This patient was seen in room MH09/MH09 and the patient's care was started 9:45 PM.   Chief Complaint  Patient presents with  . Otalgia   Patient is a 28 y.o. female presenting with ear pain. The history is provided by the patient. No language interpreter was used.  Otalgia Location:  Bilateral Severity:  Moderate Onset quality:  Gradual Duration:  2 days Timing:  Constant Progression:  Worsening Chronicity:  Recurrent Ineffective treatments:  None tried Associated symptoms: no fever     HPI Comments: Laura Wood is a 28 y.o. female who presents to the Emergency Department complaining of constant, moderate bilateral otalgia x 2 days that has progressively worsened. Pain is exacerbated when opening the mouth. No other associated symptoms at this time. Pt was seen 11/10 for same complaint but to the R ear only. She was sent home with antibiotic with improvement to R ear infection. At this time she denies any fever or chills. Laura Wood admits to using Q-Tips occasionally. No known allergies to medications. No other concerns this visit.  Past Medical History  Diagnosis Date  . Kidney infection    History reviewed. No pertinent past surgical history. History reviewed. No pertinent family history. History  Substance Use Topics  . Smoking status: Former Games developermoker  . Smokeless tobacco: Not on file  . Alcohol Use: No   OB History    No data available     Review of Systems  Constitutional: Negative for fever and chills.  HENT: Positive for ear pain.   All other systems reviewed and are negative.     Allergies  Review of patient's allergies indicates no known allergies.  Home Medications   Prior to Admission medications   Medication Sig Start Date End Date Taking?  Authorizing Provider  diazepam (VALIUM) 5 MG tablet Take 1 tablet (5 mg total) by mouth every 6 (six) hours as needed for muscle spasms (spasms). 01/28/13   Elwin MochaBlair Walden, MD  docusate sodium (COLACE) 100 MG capsule Take 1 capsule (100 mg total) by mouth every 12 (twelve) hours. 03/29/12   Carleene CooperAlan Davidson, MD  HYDROcodone-acetaminophen (NORCO/VICODIN) 5-325 MG per tablet Take 1 tablet by mouth every 6 (six) hours as needed. 01/28/13   Elwin MochaBlair Walden, MD  hydrocortisone (ANUSOL-HC) 25 MG suppository Place 1 suppository (25 mg total) rectally 2 (two) times daily. 03/29/12   Carleene CooperAlan Davidson, MD   Triage Vitals: BP 110/66 mmHg  Pulse 68  Temp(Src) 98.9 F (37.2 C) (Oral)  Resp 20  Ht 5\' 2"  (1.575 m)  Wt 150 lb (68.04 kg)  BMI 27.43 kg/m2  SpO2 98%   Physical Exam  Constitutional: She appears well-developed and well-nourished. No distress.  HENT:  Head: Normocephalic and atraumatic.  Right Ear: External ear normal.  Left Ear: There is tenderness. Tympanic membrane is not injected.  No middle ear effusion.  Mouth/Throat: No oropharyngeal exudate or posterior oropharyngeal erythema.  Tenderness to palpation to L TMJ joint  Eyes: Conjunctivae are normal. Right eye exhibits no discharge. Left eye exhibits no discharge. No scleral icterus.  Neck: Neck supple. No tracheal deviation present.  Cardiovascular: Normal rate, regular rhythm and normal heart sounds.   Pulmonary/Chest: Effort normal and breath sounds normal. No stridor. No respiratory distress.  Musculoskeletal: She exhibits  no edema.  Neurological: She is alert. Cranial nerve deficit: no gross deficits.  Skin: Skin is warm and dry. No rash noted.  Psychiatric: She has a normal mood and affect.  Nursing note and vitals reviewed.   ED Course  Procedures (including critical care time)  DIAGNOSTIC STUDIES: Oxygen Saturation is 98% on RA, Normal by my interpretation.    COORDINATION OF CARE: 9:44 PM- Will refill antibiotic prescribed from  previous visit for R sided otalgia. Discussed treatment plan with pt at bedside and pt agreed to plan.     Labs Review Labs Reviewed - No data to display  Imaging Review No results found.   EKG Interpretation None      MDM   Final diagnoses:  Otitis externa, bilateral    Otitis external now involving left ear.  Instructed her to stop using q tips.  Consider follow up with ENT.  Rx naprosyn and cortipsorin  I personally performed the services described in this documentation, which was scribed in my presence. The recorded information has been reviewed and is accurate.    Laura DibblesJon Amaury Kuzel, MD 01/15/14 2157

## 2014-01-15 NOTE — ED Notes (Signed)
Pt c/o bil ear pain seen here 2 days ago for same

## 2014-01-15 NOTE — Discharge Instructions (Signed)

## 2014-05-01 ENCOUNTER — Other Ambulatory Visit: Payer: Self-pay | Admitting: Physician Assistant

## 2014-05-01 DIAGNOSIS — N6320 Unspecified lump in the left breast, unspecified quadrant: Secondary | ICD-10-CM

## 2014-05-11 ENCOUNTER — Other Ambulatory Visit (HOSPITAL_COMMUNITY): Payer: Self-pay | Admitting: *Deleted

## 2014-05-11 DIAGNOSIS — N6320 Unspecified lump in the left breast, unspecified quadrant: Secondary | ICD-10-CM

## 2014-05-19 ENCOUNTER — Other Ambulatory Visit: Payer: Self-pay

## 2014-05-21 ENCOUNTER — Ambulatory Visit (HOSPITAL_COMMUNITY)
Admission: RE | Admit: 2014-05-21 | Discharge: 2014-05-21 | Disposition: A | Discharge status: Home / Self Care | Payer: No Typology Code available for payment source | Source: Ambulatory Visit | Attending: Obstetrics and Gynecology | Admitting: Obstetrics and Gynecology

## 2014-05-21 ENCOUNTER — Encounter (HOSPITAL_COMMUNITY): Payer: Self-pay

## 2014-05-21 VITALS — BP 108/62 | Temp 98.3°F | Ht 62.0 in | Wt 146.0 lb

## 2014-05-21 DIAGNOSIS — N6323 Unspecified lump in the left breast, lower outer quadrant: Secondary | ICD-10-CM

## 2014-05-21 DIAGNOSIS — Z1239 Encounter for other screening for malignant neoplasm of breast: Secondary | ICD-10-CM

## 2014-05-21 HISTORY — DX: Hyperlipidemia, unspecified: E78.5

## 2014-05-21 NOTE — Progress Notes (Signed)
Complaints of a left breast lump.  Pap Smear:  Pap smear not completed today. [facility] and normal per patient. Per patient has a history of an abnormal Pap smear 10 years ago that required a colposcopy and cryotherapy for follow-up. No Pap smear results in EPIC.  Physical exam: Breasts Breasts symmetrical. No skin abnormalities bilateral breasts. No nipple retraction bilateral breasts. No nipple discharge bilateral breasts. No lymphadenopathy. No lumps palpated right breast. Palpated a lump within the left breast at 5 o'clock next to the areola. Patient complained of tenderness when palpated lump within the left breast. [facility] Appointment scheduled for Friday, May 22, 2014 at 1450.  Pelvic/Bimanual No Pap smear completed today since last Pap smear was in February 2015 per patient. Pap smear not indicated per BCCCP guidelines.

## 2014-05-21 NOTE — Patient Instructions (Signed)
Education materials on self breast awareness given. Explained to  Laura Wood that she did not need a Pap smear today due to last Pap smear was in February 2015 per patient. Let her know BCCCP will cover Pap smears every 3 years unless has a history of abnormal Pap smears. [facility] Appointment scheduled for Friday, May 22, 2014 at 1450. Patient aware of appointment and will be there.  Laura Wood verbalized understanding.  [place], Kathaleen Maserhristine Poll, RN 4:33 PM

## 2014-05-22 ENCOUNTER — Ambulatory Visit
Admission: RE | Admit: 2014-05-22 | Discharge: 2014-05-22 | Disposition: A | Discharge status: Home / Self Care | Payer: No Typology Code available for payment source | Source: Ambulatory Visit | Attending: Obstetrics and Gynecology | Admitting: Obstetrics and Gynecology

## 2014-05-22 DIAGNOSIS — N6320 Unspecified lump in the left breast, unspecified quadrant: Secondary | ICD-10-CM

## 2014-10-04 ENCOUNTER — Encounter (HOSPITAL_COMMUNITY): Payer: Self-pay | Admitting: *Deleted

## 2014-10-04 ENCOUNTER — Inpatient Hospital Stay (HOSPITAL_COMMUNITY)
Admission: AD | Admit: 2014-10-04 | Discharge: 2014-10-04 | Disposition: A | Discharge status: Home / Self Care | Payer: Self-pay | Source: Ambulatory Visit | Attending: Obstetrics & Gynecology | Admitting: Obstetrics & Gynecology

## 2014-10-04 ENCOUNTER — Inpatient Hospital Stay (HOSPITAL_COMMUNITY): Payer: Self-pay

## 2014-10-04 DIAGNOSIS — Z3A01 Less than 8 weeks gestation of pregnancy: Secondary | ICD-10-CM | POA: Insufficient documentation

## 2014-10-04 DIAGNOSIS — R1030 Lower abdominal pain, unspecified: Secondary | ICD-10-CM | POA: Insufficient documentation

## 2014-10-04 DIAGNOSIS — O9989 Other specified diseases and conditions complicating pregnancy, childbirth and the puerperium: Secondary | ICD-10-CM | POA: Insufficient documentation

## 2014-10-04 DIAGNOSIS — O26899 Other specified pregnancy related conditions, unspecified trimester: Secondary | ICD-10-CM

## 2014-10-04 DIAGNOSIS — R109 Unspecified abdominal pain: Secondary | ICD-10-CM

## 2014-10-04 DIAGNOSIS — Z87891 Personal history of nicotine dependence: Secondary | ICD-10-CM | POA: Insufficient documentation

## 2014-10-04 LAB — WET PREP, GENITAL
Clue Cells Wet Prep HPF POC: NONE SEEN
Trich, Wet Prep: NONE SEEN
Yeast Wet Prep HPF POC: NONE SEEN

## 2014-10-04 LAB — URINALYSIS, ROUTINE W REFLEX MICROSCOPIC
BILIRUBIN URINE: NEGATIVE
Glucose, UA: NEGATIVE mg/dL
Ketones, ur: NEGATIVE mg/dL
LEUKOCYTES UA: NEGATIVE
NITRITE: NEGATIVE
PH: 5.5 (ref 5.0–8.0)
Protein, ur: NEGATIVE mg/dL
SPECIFIC GRAVITY, URINE: 1.01 (ref 1.005–1.030)
UROBILINOGEN UA: 0.2 mg/dL (ref 0.0–1.0)

## 2014-10-04 LAB — CBC
HCT: 38 % (ref 36.0–46.0)
Hemoglobin: 13.1 g/dL (ref 12.0–15.0)
MCH: 32.1 pg (ref 26.0–34.0)
MCHC: 34.5 g/dL (ref 30.0–36.0)
MCV: 93.1 fL (ref 78.0–100.0)
PLATELETS: 229 10*3/uL (ref 150–400)
RBC: 4.08 MIL/uL (ref 3.87–5.11)
RDW: 13 % (ref 11.5–15.5)
WBC: 10.3 10*3/uL (ref 4.0–10.5)

## 2014-10-04 LAB — URINE MICROSCOPIC-ADD ON

## 2014-10-04 LAB — POCT PREGNANCY, URINE: PREG TEST UR: POSITIVE — AB

## 2014-10-04 LAB — HCG, QUANTITATIVE, PREGNANCY: hCG, Beta Chain, Quant, S: 10493 m[IU]/mL — ABNORMAL HIGH (ref <5)

## 2014-10-04 NOTE — Discharge Instructions (Signed)

## 2014-10-04 NOTE — MAU Provider Note (Signed)
History     CSN: [id number]  Arrival date and time: 10/04/14 1191   First Provider Initiated Contact with Patient 10/04/14 2041      Chief Complaint  Patient presents with  . Abdominal Pain   HPI  Ms. Laura Wood is a 29 y.o. G3P2002 at [redacted]w[redacted]d here with report of + pregnancy test at [facility].  Reports lower abdominal pain that started about 3 days ago. Patient's last menstrual period was 09/03/2014. Denies vaginal bleeding. Reports feeling like need to urinate a lot more than usual but denies any other urinary s/s.  Past Medical History  Diagnosis Date  . Kidney infection   . Hyperlipemia     History reviewed. No pertinent past surgical history.  Family History  Problem Relation Age of Onset  . Hypertension Mother   . Diabetes Maternal Grandfather   . Diabetes Paternal Grandmother   . Diabetes Paternal Grandfather     History  Substance Use Topics  . Smoking status: Former Games developer  . Smokeless tobacco: Never Used  . Alcohol Use: No    Allergies: No Known Allergies  Prescriptions prior to admission  Medication Sig Dispense Refill Last Dose  . diphenhydrAMINE (BENADRYL) 25 MG tablet Take 25 mg by mouth every 6 (six) hours as needed for allergies.   PRN at PRN  . Prenatal Vit-Fe Fumarate-FA (PRENATAL MULTIVITAMIN) TABS tablet Take 1 tablet by mouth daily.   10/04/2014 at Unknown time    Review of Systems  Constitutional: Negative for fever and chills.  Gastrointestinal: Positive for nausea and abdominal pain (midpelvic). Negative for vomiting.  Genitourinary: Positive for frequency. Negative for dysuria, urgency and flank pain.  All other systems reviewed and are negative.  Physical Exam   Blood pressure 103/68, pulse 84, temperature 98.3 F (36.8 C), temperature source Oral, resp. rate 18, height 5\' 3"  (1.6 m), weight 66.679 kg (147 lb), last menstrual period 09/03/2014, SpO2 100 %.  Physical Exam  Constitutional: She is oriented to  person, place, and time. She appears well-developed and well-nourished. No distress.  HENT:  Head: Normocephalic.  Neck: Normal range of motion. Neck supple.  Cardiovascular: Normal rate, regular rhythm and normal heart sounds.  Exam reveals no gallop and no friction rub.   No murmur heard. Respiratory: Effort normal and breath sounds normal. No respiratory distress.  GI: Soft. There is no tenderness. There is no CVA tenderness.  Genitourinary: Uterus is enlarged (approximate 8 wk size). Cervix exhibits friability. Cervix exhibits no motion tenderness and no discharge. No vaginal discharge found.  Musculoskeletal: Normal range of motion.  Neurological: She is alert and oriented to person, place, and time.  Skin: Skin is warm and dry.  Psychiatric: She has a normal mood and affect.    MAU Course  Procedures  Results for orders placed or performed during the hospital encounter of 10/04/14 (from the past 24 hour(s))  Urinalysis, Routine w reflex microscopic (not at [facility])     Status: Abnormal   Collection Time: 10/04/14  8:05 PM  Result Value Ref Range   Color, Urine YELLOW YELLOW   APPearance CLEAR CLEAR   Specific Gravity, Urine 1.010 1.005 - 1.030   pH 5.5 5.0 - 8.0   Glucose, UA NEGATIVE NEGATIVE mg/dL   Hgb urine dipstick TRACE (A) NEGATIVE   Bilirubin Urine NEGATIVE NEGATIVE   Ketones, ur NEGATIVE NEGATIVE mg/dL   Protein, ur NEGATIVE NEGATIVE mg/dL   Urobilinogen, UA 0.2 0.0 - 1.0 mg/dL   Nitrite NEGATIVE NEGATIVE  Leukocytes, UA NEGATIVE NEGATIVE  Urine microscopic-add on     Status: Abnormal   Collection Time: 10/04/14  8:05 PM  Result Value Ref Range   Squamous Epithelial / LPF RARE RARE   WBC, UA 0-2 <3 WBC/hpf   RBC / HPF 0-2 <3 RBC/hpf   Bacteria, UA FEW (A) RARE  Pregnancy, urine POC     Status: Abnormal   Collection Time: 10/04/14  8:21 PM  Result Value Ref Range   Preg Test, Ur POSITIVE (A) NEGATIVE  CBC     Status: None   Collection Time: 10/04/14  8:50 PM   Result Value Ref Range   WBC 10.3 4.0 - 10.5 K/uL   RBC 4.08 3.87 - 5.11 MIL/uL   Hemoglobin 13.1 12.0 - 15.0 g/dL   HCT 16.1 09.6 - 04.5 %   MCV 93.1 78.0 - 100.0 fL   MCH 32.1 26.0 - 34.0 pg   MCHC 34.5 30.0 - 36.0 g/dL   RDW 40.9 81.1 - 91.4 %   Platelets 229 150 - 400 K/uL  Wet prep, genital     Status: Abnormal   Collection Time: 10/04/14  8:50 PM  Result Value Ref Range   Yeast Wet Prep HPF POC NONE SEEN NONE SEEN   Trich, Wet Prep NONE SEEN NONE SEEN   Clue Cells Wet Prep HPF POC NONE SEEN NONE SEEN   WBC, Wet Prep HPF POC FEW (A) NONE SEEN   Ultrasound: Maternal uterus/adnexae: No subchorionic hemorrhage. Right ovary is normal measuring 3.3 x 1.1 x 1.9 cm with blood flow. Left ovary measures 4.0 x 1.7 x 2.2 cm with probable corpus luteal cyst. Blood flow seen. Trace free fluid in the cul-de-sac.  IMPRESSION: Intrauterine gestational sac with yolk sac. Fetal pole and cardiac activity not yet seen, likely due to early gestational age.  Assessment and Plan  29 y.o. G3P2002 at [redacted]w[redacted]d IUP Abdominal Pain in Pregnancy - Normal Exam  Plan: GC/CT pending Begin prenatal care - given list of providers Prenatal vitamins  Rochele Pages N 10/04/2014, 9:50 PM

## 2014-10-04 NOTE — MAU Note (Signed)
Discharge teaching complete.  Pt. Voiced understanding, denies any needs at this time.

## 2014-10-04 NOTE — MAU Note (Signed)
+   [facility] and having lower abdominal pain that started about 3 days ago. Denies vaginal bleeding. Feels like she has to urinate a lot more than usual but denies any other urinary s/s.

## 2014-10-05 ENCOUNTER — Encounter (HOSPITAL_COMMUNITY): Payer: Self-pay

## 2014-10-05 ENCOUNTER — Encounter (HOSPITAL_COMMUNITY): Payer: Self-pay | Admitting: *Deleted

## 2014-10-05 LAB — GC/CHLAMYDIA PROBE AMP (~~LOC~~) NOT AT ARMC
Chlamydia: NEGATIVE
NEISSERIA GONORRHEA: NEGATIVE

## 2014-10-05 LAB — HIV ANTIBODY (ROUTINE TESTING W REFLEX): HIV Screen 4th Generation wRfx: NONREACTIVE

## 2014-10-12 ENCOUNTER — Inpatient Hospital Stay (HOSPITAL_COMMUNITY): Payer: Worker's Compensation

## 2014-10-12 ENCOUNTER — Encounter (HOSPITAL_COMMUNITY): Payer: Self-pay | Admitting: *Deleted

## 2014-10-12 ENCOUNTER — Inpatient Hospital Stay (HOSPITAL_COMMUNITY)
Admission: AD | Admit: 2014-10-12 | Discharge: 2014-10-12 | Disposition: A | Discharge status: Home / Self Care | Payer: Self-pay | Source: Ambulatory Visit | Attending: Family Medicine | Admitting: Family Medicine

## 2014-10-12 DIAGNOSIS — O209 Hemorrhage in early pregnancy, unspecified: Secondary | ICD-10-CM | POA: Insufficient documentation

## 2014-10-12 DIAGNOSIS — Z3A01 Less than 8 weeks gestation of pregnancy: Secondary | ICD-10-CM | POA: Insufficient documentation

## 2014-10-12 DIAGNOSIS — O4691 Antepartum hemorrhage, unspecified, first trimester: Secondary | ICD-10-CM

## 2014-10-12 DIAGNOSIS — Z87891 Personal history of nicotine dependence: Secondary | ICD-10-CM | POA: Insufficient documentation

## 2014-10-12 LAB — URINALYSIS, ROUTINE W REFLEX MICROSCOPIC
Bilirubin Urine: NEGATIVE
GLUCOSE, UA: NEGATIVE mg/dL
Ketones, ur: NEGATIVE mg/dL
LEUKOCYTES UA: NEGATIVE
Nitrite: NEGATIVE
Protein, ur: NEGATIVE mg/dL
Specific Gravity, Urine: 1.015 (ref 1.005–1.030)
Urobilinogen, UA: 0.2 mg/dL (ref 0.0–1.0)
pH: 6 (ref 5.0–8.0)

## 2014-10-12 LAB — CBC WITH DIFFERENTIAL/PLATELET
Basophils Absolute: 0.1 10*3/uL (ref 0.0–0.1)
Basophils Relative: 1 % (ref 0–1)
EOS ABS: 0.2 10*3/uL (ref 0.0–0.7)
Eosinophils Relative: 2 % (ref 0–5)
HCT: 35.5 % — ABNORMAL LOW (ref 36.0–46.0)
Hemoglobin: 12.2 g/dL (ref 12.0–15.0)
LYMPHS ABS: 3.2 10*3/uL (ref 0.7–4.0)
LYMPHS PCT: 28 % (ref 12–46)
MCH: 31.9 pg (ref 26.0–34.0)
MCHC: 34.4 g/dL (ref 30.0–36.0)
MCV: 92.9 fL (ref 78.0–100.0)
MONOS PCT: 5 % (ref 3–12)
Monocytes Absolute: 0.6 10*3/uL (ref 0.1–1.0)
NEUTROS ABS: 7.4 10*3/uL (ref 1.7–7.7)
NEUTROS PCT: 64 % (ref 43–77)
PLATELETS: 212 10*3/uL (ref 150–400)
RBC: 3.82 MIL/uL — ABNORMAL LOW (ref 3.87–5.11)
RDW: 13.1 % (ref 11.5–15.5)
WBC: 11.5 10*3/uL — ABNORMAL HIGH (ref 4.0–10.5)

## 2014-10-12 LAB — URINE MICROSCOPIC-ADD ON

## 2014-10-12 MED ORDER — ACETAMINOPHEN 500 MG PO TABS
1000.0000 mg | ORAL_TABLET | Freq: Once | ORAL | Status: AC
  Administered 2014-10-12: 1000 mg via ORAL
  Filled 2014-10-12: qty 2

## 2014-10-12 NOTE — Discharge Instructions (Signed)
First Trimester of Pregnancy The first trimester of pregnancy is from week 1 until the end of week 12 (months 1 through 3). A week after a sperm fertilizes an egg, the egg will implant on the wall of the uterus. This embryo will begin to develop into a baby. Genes from you and your partner are forming the baby. The female genes determine whether the baby is a boy or a girl. At 6-8 weeks, the eyes and face are formed, and the heartbeat can be seen on ultrasound. At the end of 12 weeks, all the baby's organs are formed.  Now that you are pregnant, you will want to do everything you can to have a healthy baby. Two of the most important things are to get good prenatal care and to follow your health care provider's instructions. Prenatal care is all the medical care you receive before the baby's birth. This care will help prevent, find, and treat any problems during the pregnancy and childbirth. BODY CHANGES Your body goes through many changes during pregnancy. The changes vary from woman to woman.   You may gain or lose a couple of pounds at first.  You may feel sick to your stomach (nauseous) and throw up (vomit). If the vomiting is uncontrollable, call your health care provider.  You may tire easily.  You may develop headaches that can be relieved by medicines approved by your health care provider.  You may urinate more often. Painful urination may mean you have a bladder infection.  You may develop heartburn as a result of your pregnancy.  You may develop constipation because certain hormones are causing the muscles that push waste through your intestines to slow down.  You may develop hemorrhoids or swollen, bulging veins (varicose veins).  Your breasts may begin to grow larger and become tender. Your nipples may stick out more, and the tissue that surrounds them (areola) may become darker.  Your gums may bleed and may be sensitive to brushing and flossing.  Dark spots or blotches (chloasma,  mask of pregnancy) may develop on your face. This will likely fade after the baby is born.  Your menstrual periods will stop.  You may have a loss of appetite.  You may develop cravings for certain kinds of food.  You may have changes in your emotions from day to day, such as being excited to be pregnant or being concerned that something may go wrong with the pregnancy and baby.  You may have more vivid and strange dreams.  You may have changes in your hair. These can include thickening of your hair, rapid growth, and changes in texture. Some women also have hair loss during or after pregnancy, or hair that feels dry or thin. Your hair will most likely return to normal after your baby is born. WHAT TO EXPECT AT YOUR PRENATAL VISITS During a routine prenatal visit:  You will be weighed to make sure you and the baby are growing normally.  Your blood pressure will be taken.  Your abdomen will be measured to track your baby's growth.  The fetal heartbeat will be listened to starting around week 10 or 12 of your pregnancy.  Test results from any previous visits will be discussed. Your health care provider may ask you:  How you are feeling.  If you are feeling the baby move.  If you have had any abnormal symptoms, such as leaking fluid, bleeding, severe headaches, or abdominal cramping.  If you have any questions. Other tests   that may be performed during your first trimester include:  Blood tests to find your blood type and to check for the presence of any previous infections. They will also be used to check for low iron levels (anemia) and Rh antibodies. Later in the pregnancy, blood tests for diabetes will be done along with other tests if problems develop.  Urine tests to check for infections, diabetes, or protein in the urine.  An ultrasound to confirm the proper growth and development of the baby.  An amniocentesis to check for possible genetic problems.  Fetal screens for  spina bifida and Down syndrome.  You may need other tests to make sure you and the baby are doing well. HOME CARE INSTRUCTIONS  Medicines  Follow your health care provider's instructions regarding medicine use. Specific medicines may be either safe or unsafe to take during pregnancy.  Take your prenatal vitamins as directed.  If you develop constipation, try taking a stool softener if your health care provider approves. Diet  Eat regular, well-balanced meals. Choose a variety of foods, such as meat or vegetable-based protein, fish, milk and low-fat dairy products, vegetables, fruits, and whole grain breads and cereals. Your health care provider will help you determine the amount of weight gain that is right for you.  Avoid raw meat and uncooked cheese. These carry germs that can cause birth defects in the baby.  Eating four or five small meals rather than three large meals a day may help relieve nausea and vomiting. If you start to feel nauseous, eating a few soda crackers can be helpful. Drinking liquids between meals instead of during meals also seems to help nausea and vomiting.  If you develop constipation, eat more high-fiber foods, such as fresh vegetables or fruit and whole grains. Drink enough fluids to keep your urine clear or pale yellow. Activity and Exercise  Exercise only as directed by your health care provider. Exercising will help you:  Control your weight.  Stay in shape.  Be prepared for labor and delivery.  Experiencing pain or cramping in the lower abdomen or low back is a good sign that you should stop exercising. Check with your health care provider before continuing normal exercises.  Try to avoid standing for long periods of time. Move your legs often if you must stand in one place for a long time.  Avoid heavy lifting.  Wear low-heeled shoes, and practice good posture.  You may continue to have sex unless your health care provider directs you  otherwise. Relief of Pain or Discomfort  Wear a good support bra for breast tenderness.   Take warm sitz baths to soothe any pain or discomfort caused by hemorrhoids. Use hemorrhoid cream if your health care provider approves.   Rest with your legs elevated if you have leg cramps or low back pain.  If you develop varicose veins in your legs, wear support hose. Elevate your feet for 15 minutes, 3-4 times a day. Limit salt in your diet. Prenatal Care  Schedule your prenatal visits by the twelfth week of pregnancy. They are usually scheduled monthly at first, then more often in the last 2 months before delivery.  Write down your questions. Take them to your prenatal visits.  Keep all your prenatal visits as directed by your health care provider. Safety  Wear your seat belt at all times when driving.  Make a list of emergency phone numbers, including numbers for family, friends, the hospital, and police and fire departments. General Tips    Ask your health care provider for a referral to a local prenatal education class. Begin classes no later than at the beginning of month 6 of your pregnancy.  Ask for help if you have counseling or nutritional needs during pregnancy. Your health care provider can offer advice or refer you to specialists for help with various needs.  Do not use hot tubs, steam rooms, or saunas.  Do not douche or use tampons or scented sanitary pads.  Do not cross your legs for long periods of time.  Avoid cat litter boxes and soil used by cats. These carry germs that can cause birth defects in the baby and possibly loss of the fetus by miscarriage or stillbirth.  Avoid all smoking, herbs, alcohol, and medicines not prescribed by your health care provider. Chemicals in these affect the formation and growth of the baby.  Schedule a dentist appointment. At home, brush your teeth with a soft toothbrush and be gentle when you floss. SEEK MEDICAL CARE IF:   You have  dizziness.  You have mild pelvic cramps, pelvic pressure, or nagging pain in the abdominal area.  You have persistent nausea, vomiting, or diarrhea.  You have a bad smelling vaginal discharge.  You have pain with urination.  You notice increased swelling in your face, hands, legs, or ankles. SEEK IMMEDIATE MEDICAL CARE IF:   You have a fever.  You are leaking fluid from your vagina.  You have spotting or bleeding from your vagina.  You have severe abdominal cramping or pain.  You have rapid weight gain or loss.  You vomit blood or material that looks like coffee grounds.  You are exposed to German measles and have never had them.  You are exposed to fifth disease or chickenpox.  You develop a severe headache.  You have shortness of breath.  You have any kind of trauma, such as from a fall or a car accident. Document Released: 02/14/2001 Document Revised: 07/07/2013 Document Reviewed: 12/31/2012 ExitCare Patient Information 2015 [employer]. This information is not intended to replace advice given to you by your health care provider. Make sure you discuss any questions you have with your health care provider.  

## 2014-10-12 NOTE — MAU Note (Signed)
Pt reports she is [redacted] weeks pregnant and she started bleeding about one hour ago. Also began having cramping at that time.

## 2014-10-12 NOTE — MAU Provider Note (Signed)
History     CSN: [id number]  Arrival date and time: 10/12/14 1919   None     No chief complaint on file.  Vaginal Bleeding The patient's primary symptoms include vaginal bleeding. This is a new problem. The current episode started today. The problem occurs constantly. The problem has been unchanged. The pain is moderate. The problem affects both sides. She is pregnant. Associated symptoms include abdominal pain. Pertinent negatives include no constipation, diarrhea, dysuria, fever, nausea, urgency or vomiting. The vaginal discharge was bloody. The vaginal bleeding is spotting. She has not been passing clots. She has not been passing tissue. Nothing aggravates the symptoms. She has tried nothing for the symptoms.    Past Medical History  Diagnosis Date  . Kidney infection   . Hyperlipemia     Past Surgical History  Procedure Laterality Date  . No past surgeries      Family History  Problem Relation Age of Onset  . Hypertension Mother   . Diabetes Maternal Grandfather   . Diabetes Paternal Grandmother   . Diabetes Paternal Grandfather     History  Substance Use Topics  . Smoking status: Former Smoker    Quit date: 10/11/2013  . Smokeless tobacco: Never Used  . Alcohol Use: Yes     Comment: not while preg    Allergies: No Known Allergies  Prescriptions prior to admission  Medication Sig Dispense Refill Last Dose  . albuterol (PROVENTIL HFA;VENTOLIN HFA) 108 (90 BASE) MCG/ACT inhaler Inhale 1 puff into the lungs every 6 (six) hours as needed for wheezing or shortness of breath.   rescue  . Prenatal Vit-Fe Fumarate-FA (PRENATAL MULTIVITAMIN) TABS tablet Take 1 tablet by mouth daily.   10/12/2014 at 1000    Review of Systems  Constitutional: Negative for fever.  Gastrointestinal: Positive for abdominal pain. Negative for nausea, vomiting, diarrhea and constipation.  Genitourinary: Positive for vaginal bleeding. Negative for dysuria and urgency.   Physical Exam    Blood pressure 105/64, pulse 72, temperature 98.4 F (36.9 C), temperature source Oral, resp. rate 15, height  (1.575 m), weight 67.586 kg (149 lb), last menstrual period 09/03/2014, SpO2 100 %.  Physical Exam  SEE PRIOR PROVIDER NOTE FOR PHYSICAL EXAM   Results for orders placed or performed during the hospital encounter of 10/12/14 (from the past 24 hour(s))  CBC with Differential     Status: Abnormal   Collection Time: 10/12/14  7:40 PM  Result Value Ref Range   WBC 11.5 (H) 4.0 - 10.5 K/uL   RBC 3.82 (L) 3.87 - 5.11 MIL/uL   Hemoglobin 12.2 12.0 - 15.0 g/dL   HCT 40.9 (L) 81.1 - 91.4 %   MCV 92.9 78.0 - 100.0 fL   MCH 31.9 26.0 - 34.0 pg   MCHC 34.4 30.0 - 36.0 g/dL   RDW 78.2 95.6 - 21.3 %   Platelets 212 150 - 400 K/uL   Neutrophils Relative % 64 43 - 77 %   Neutro Abs 7.4 1.7 - 7.7 K/uL   Lymphocytes Relative 28 12 - 46 %   Lymphs Abs 3.2 0.7 - 4.0 K/uL   Monocytes Relative 5 3 - 12 %   Monocytes Absolute 0.6 0.1 - 1.0 K/uL   Eosinophils Relative 2 0 - 5 %   Eosinophils Absolute 0.2 0.0 - 0.7 K/uL   Basophils Relative 1 0 - 1 %   Basophils Absolute 0.1 0.0 - 0.1 K/uL  ABO/Rh     Status: None (Preliminary result)  Collection Time: 10/12/14  7:40 PM  Result Value Ref Range   ABO/RH(D) O POS   Urinalysis, Routine w reflex microscopic (not at [facility])     Status: Abnormal   Collection Time: 10/12/14  7:40 PM  Result Value Ref Range   Color, Urine YELLOW YELLOW   APPearance CLEAR CLEAR   Specific Gravity, Urine 1.015 1.005 - 1.030   pH 6.0 5.0 - 8.0   Glucose, UA NEGATIVE NEGATIVE mg/dL   Hgb urine dipstick LARGE (A) NEGATIVE   Bilirubin Urine NEGATIVE NEGATIVE   Ketones, ur NEGATIVE NEGATIVE mg/dL   Protein, ur NEGATIVE NEGATIVE mg/dL   Urobilinogen, UA 0.2 0.0 - 1.0 mg/dL   Nitrite NEGATIVE NEGATIVE   Leukocytes, UA NEGATIVE NEGATIVE  Urine microscopic-add on     Status: Abnormal   Collection Time: 10/12/14  7:40 PM  Result Value Ref Range   Squamous  Epithelial / LPF FEW (A) RARE   WBC, UA 0-2 <3 WBC/hpf   RBC / HPF 0-2 <3 RBC/hpf   Bacteria, UA RARE RARE   US Ob Transvaginal  10/12/2014   CLINICAL DATA:  Acute onset of vaginal bleeding.  Initial encounter.  EXAM: TRANSVAGINAL OB ULTRASOUND  TECHNIQUE: Transvaginal ultrasound was performed for complete evaluation of the gestation as well as the maternal uterus, adnexal regions, and pelvic cul-de-sac.  COMPARISON:  Pelvic ultrasound performed 10/04/2014  FINDINGS: Intrauterine gestational sac: Visualized/normal in shape.  Yolk sac:  Yes  Embryo:  Yes  Cardiac Activity: Yes  Heart Rate: 125 bpm  CRL:   6.8 mm   6 w 4 d                  [place] EDC: 06/04/2015  Maternal uterus/adnexae: A small amount of subchorionic hemorrhage is noted. The uterus is otherwise unremarkable.  The ovaries are within normal limits. The right ovary measures 3.9 x 1.8 x 1.7 cm, while the left ovary measures 4.2 x 1.9 x 2.7 cm. No suspicious adnexal masses are seen; there is no evidence for torsion.  Trace free fluid is seen within the pelvic cul-de-sac.  IMPRESSION: 1. Single live intrauterine pregnancy noted, with a crown-rump length of 7 mm, corresponding to a gestational age of [redacted] weeks 4 days. This matches the gestational age of [redacted] weeks 4 days by LMP, reflecting an estimated date of delivery of June 10, 2015. 2. Small amount of subchorionic hemorrhage noted.   Electronically Signed   By: Roanna Raider M.D.   On: 10/12/2014 22:06    MAU Course  Procedures  MDM   Assessment and Plan   1. Vaginal bleeding in pregnancy, first trimester    DC home Comfort measures reviewed  1st Trimester precautions  Bleeding precautions RX: none  Return to MAU as needed FU with OB as planned  Follow-up Information    Follow up with [facility] [place].   Contact information:   [address] [facility] [address] [phone]        Tawnya Crook 10/12/2014, 10:27 PM

## 2014-10-12 NOTE — MAU Provider Note (Signed)
  History     CSN: [id number]  Arrival date and time: 10/12/14 1919   None     No chief complaint on file.  HPI   G3P2 at 5.4 days in with cramping and bleding that started this afternoon. OB History    Gravida Para Term Preterm AB TAB SAB Ectopic Multiple Living   0 0 0 0 0  2      Past Medical History  Diagnosis Date  . Kidney infection   . Hyperlipemia     Past Surgical History  Procedure Laterality Date  . No past surgeries      Family History  Problem Relation Age of Onset  . Hypertension Mother   . Diabetes Maternal Grandfather   . Diabetes Paternal Grandmother   . Diabetes Paternal Grandfather     History  Substance Use Topics  . Smoking status: Former Smoker    Quit date: 10/11/2013  . Smokeless tobacco: Never Used  . Alcohol Use: Yes     Comment: not while preg    Allergies: No Known Allergies  Prescriptions prior to admission  Medication Sig Dispense Refill Last Dose  . diphenhydrAMINE (BENADRYL) 25 MG tablet Take 25 mg by mouth every 6 (six) hours as needed for allergies.   PRN at PRN  . Prenatal Vit-Fe Fumarate-FA (PRENATAL MULTIVITAMIN) TABS tablet Take 1 tablet by mouth daily.   10/04/2014 at Unknown time    Review of Systems  Constitutional: Negative.   HENT: Negative.   Eyes: Negative.   Respiratory: Negative.   Cardiovascular: Negative.   Gastrointestinal: Positive for abdominal pain.  Genitourinary:       Sm amt vag bleeding  Musculoskeletal: Negative.   Skin: Negative.   Neurological: Negative.   Endo/Heme/Allergies: Negative.   Psychiatric/Behavioral: Negative.    Physical Exam   Blood pressure 98/61, pulse 94, temperature 98.4 F (36.9 C), temperature source Oral, resp. rate 16, height  (1.575 m), weight 149 lb (67.586 kg), last menstrual period 09/03/2014, SpO2 100 %.  Physical Exam  Constitutional: She is oriented to person, place, and time. She appears well-developed and well-nourished.  HENT:  Head:  Normocephalic.  Eyes: Pupils are equal, round, and reactive to light.  Neck: Normal range of motion.  Cardiovascular: Normal rate, regular rhythm, normal heart sounds and intact distal pulses.   Respiratory: Effort normal and breath sounds normal.  GI: Soft. Bowel sounds are normal.  Genitourinary:  sm amt vag bleeding  Musculoskeletal: Normal range of motion.  Neurological: She is alert and oriented to person, place, and time. She has normal reflexes.  Skin: Skin is warm and dry.  Psychiatric: She has a normal mood and affect. Her behavior is normal. Judgment and thought content normal.    MAU Course  Procedures  MDM Threat ab, d/c  Assessment and Plan  Threat ab, cbc,ABO and rh. Ob u/s.  Wyvonnia Dusky DARLENE 10/12/2014, 7:59 PM

## 2014-10-13 LAB — ABO/RH: ABO/RH(D): O POS

## 2015-08-09 ENCOUNTER — Encounter (HOSPITAL_COMMUNITY): Payer: Self-pay | Admitting: *Deleted

## 2017-04-03 IMAGING — US US OB TRANSVAGINAL
1 series · 14 of 28 positions shown · non-contrast
Comparison: None.

CLINICAL DATA: Pregnant patient with abdominal pain.

EXAM:
OBSTETRIC <14 WK US AND TRANSVAGINAL OB US
TECHNIQUE: Both transabdominal and transvaginal ultrasound examinations were
performed for complete evaluation of the gestation as well as the
maternal uterus, adnexal regions, and pelvic cul-de-sac.
Transvaginal technique was performed to assess early pregnancy.

[Series 1: us ob comp less 14 wk · 14 of 37 slices shown]
[im 2/37]
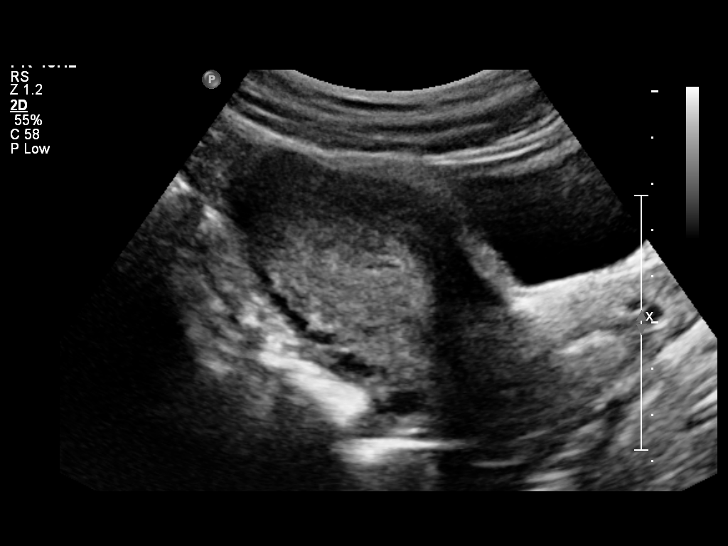
[im 5/37]
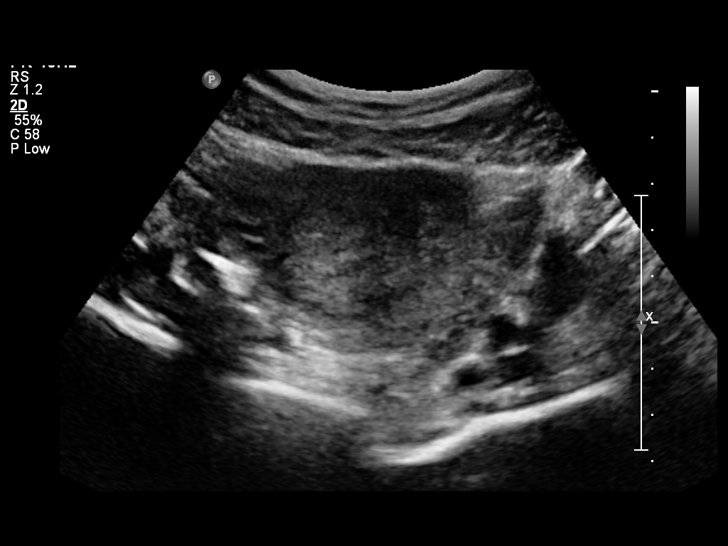
[im 7/37]
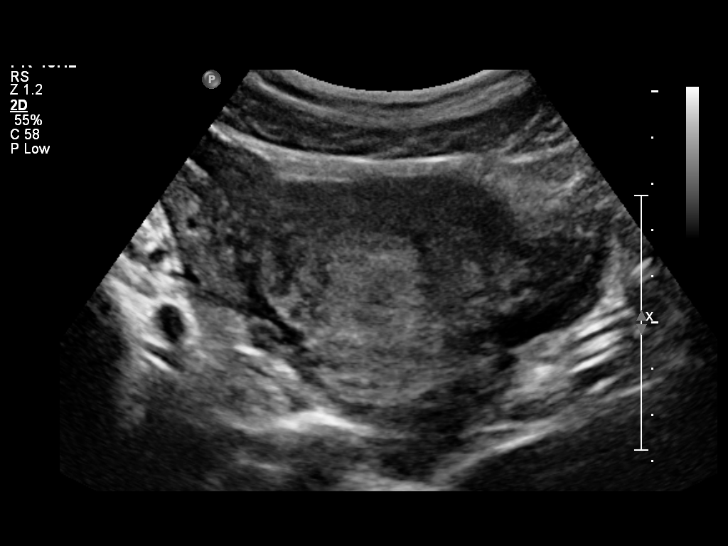
[im 10/37]
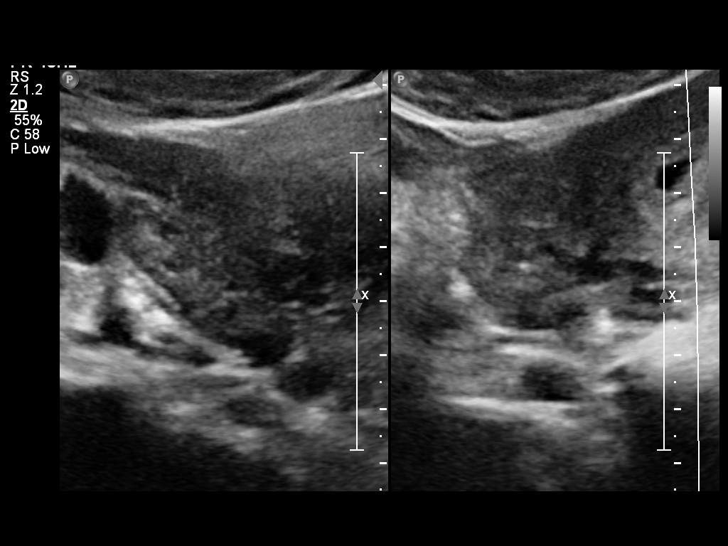
[im 13/37]
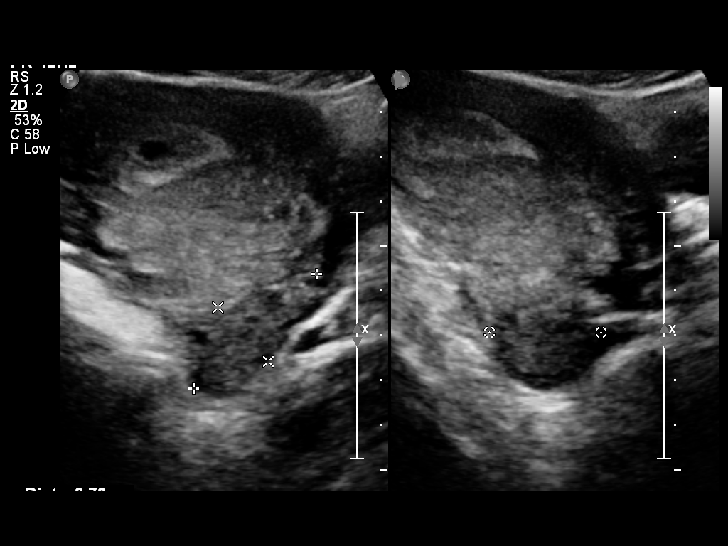
[im 15/37]
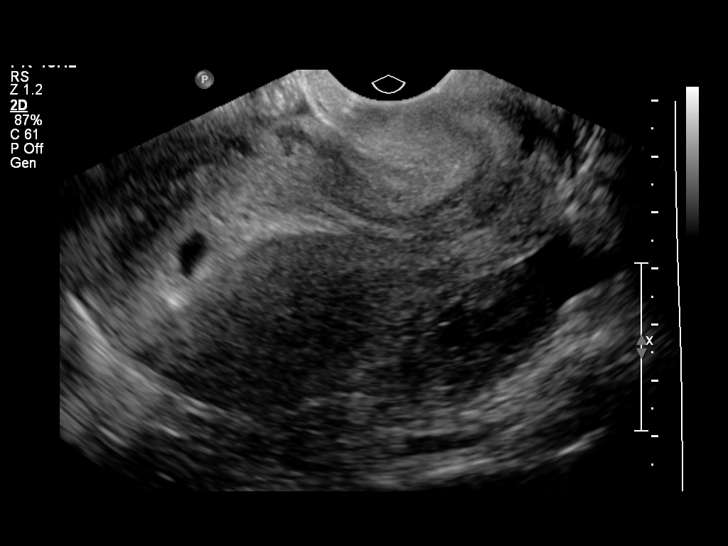
[im 18/37]
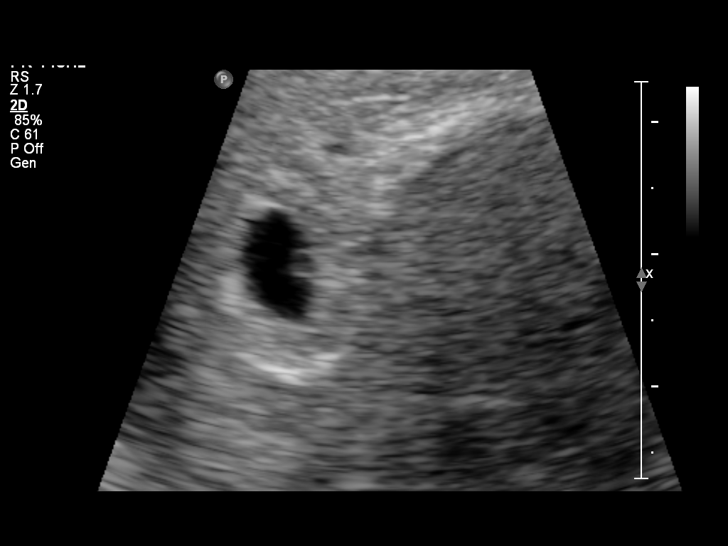
[im 21/37]
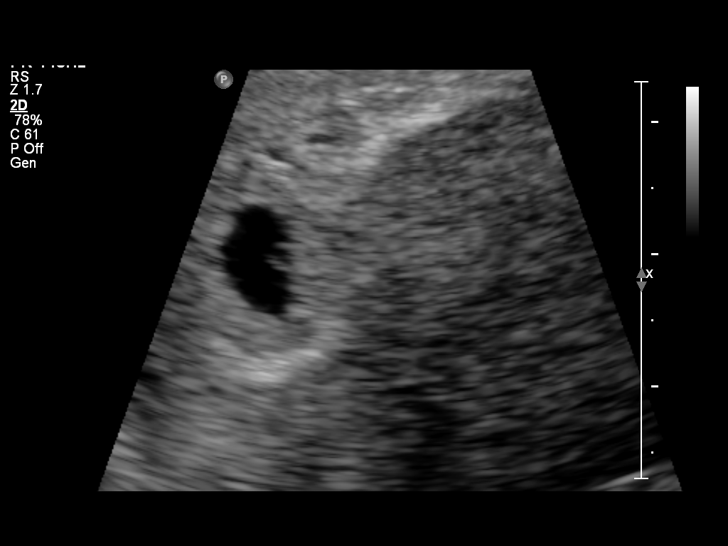
[im 23/37]
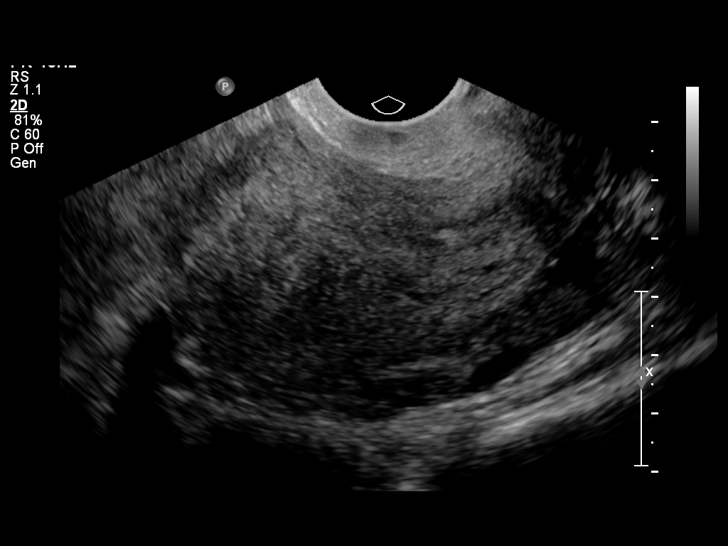
[im 26/37]
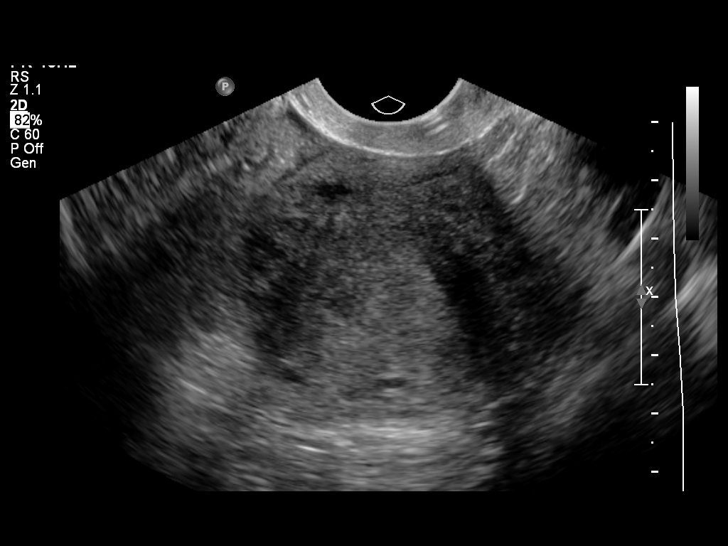
[im 29/37]
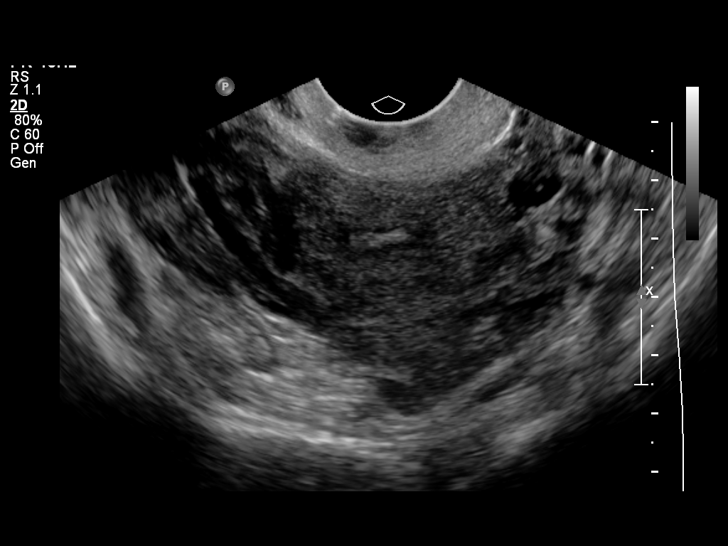
[im 31/37]
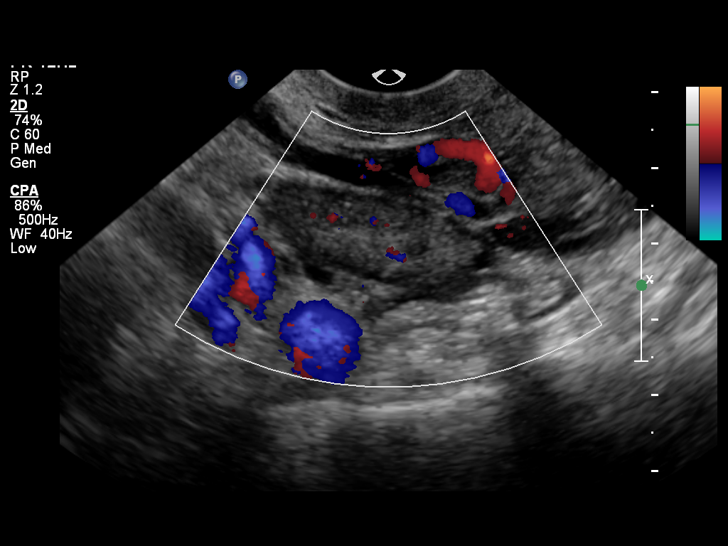
[im 34/37]
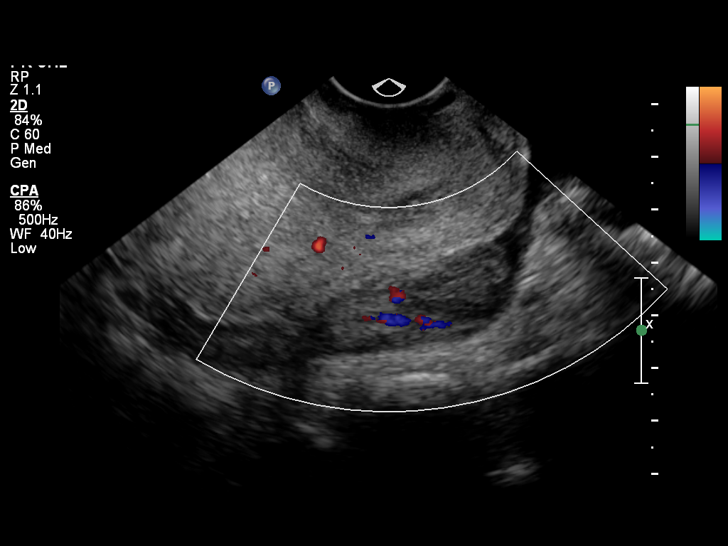
[im 37/37]
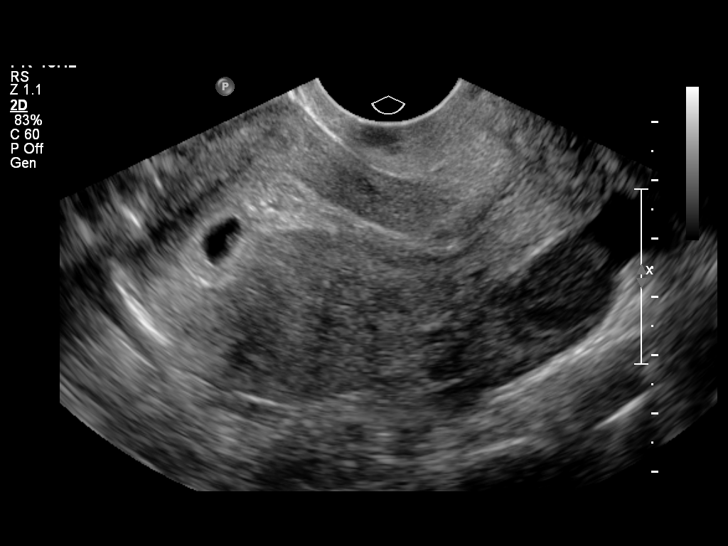

[14 of 28 positions shown; findings below may reference images not displayed]

FINDINGS: Intrauterine gestational sac: Visualized/normal in shape.

Yolk sac:  Present.

Embryo:  Not present.

Cardiac Activity: Not present.

MSD: 8.3  mm   5 w   3  d

Maternal uterus/adnexae: No subchorionic hemorrhage. Right ovary is
normal measuring 3.3 x 1.1 x 1.9 cm with blood flow. Left ovary
measures 4.0 x 1.7 x 2.2 cm with probable corpus luteal cyst. Blood
flow seen. Trace free fluid in the cul-de-sac.
IMPRESSION: Intrauterine gestational sac with yolk sac. Fetal pole and cardiac
activity not yet seen, likely due to early gestational age.

## 2017-04-11 IMAGING — US US OB TRANSVAGINAL
1 series · 13 of 28 positions shown · non-contrast
Comparison: Pelvic ultrasound performed 10/04/2014

CLINICAL DATA: Acute onset of vaginal bleeding.  Initial encounter.

EXAM:
TRANSVAGINAL OB ULTRASOUND
TECHNIQUE: Transvaginal ultrasound was performed for complete evaluation of the
gestation as well as the maternal uterus, adnexal regions, and
pelvic cul-de-sac.

[Series 1: us ob +14 all · 68 acquisitions, 13 frames shown]
[im 3/68]
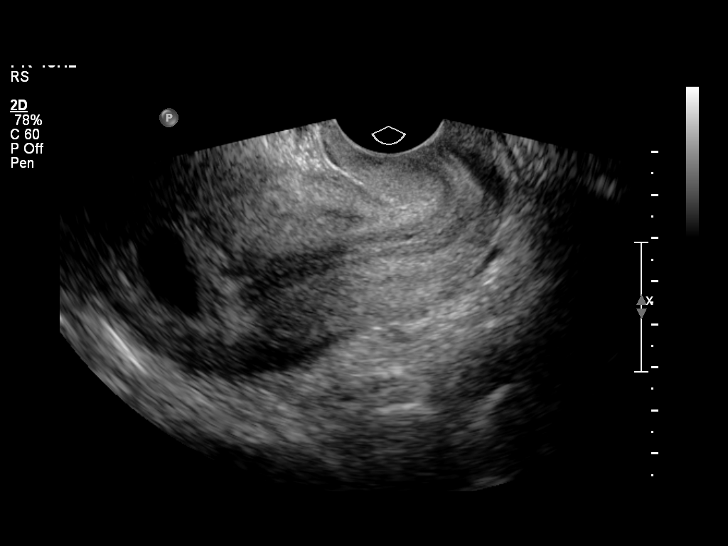
[im 8/68]
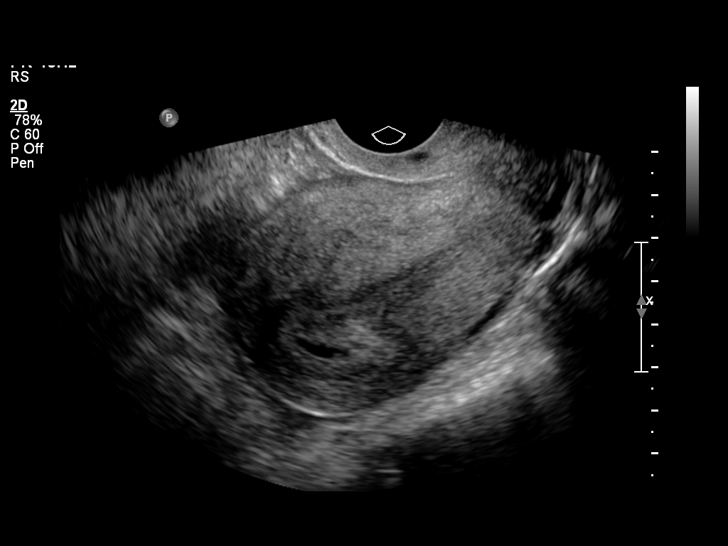
[im 13/68]
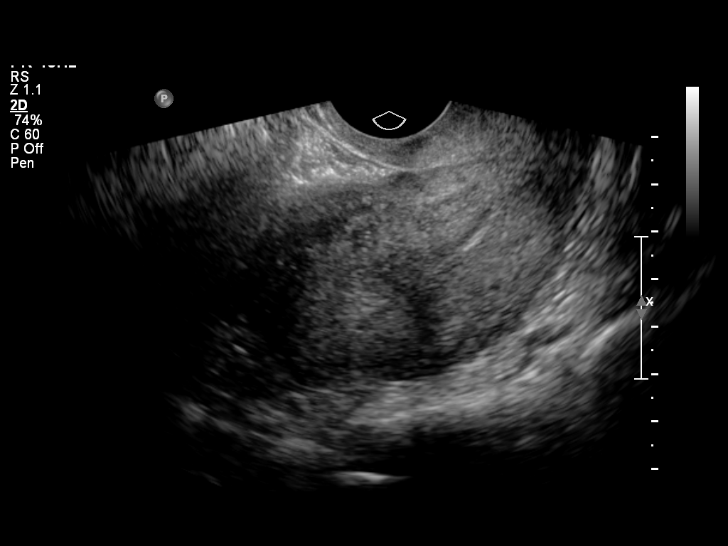
[im 18/68]
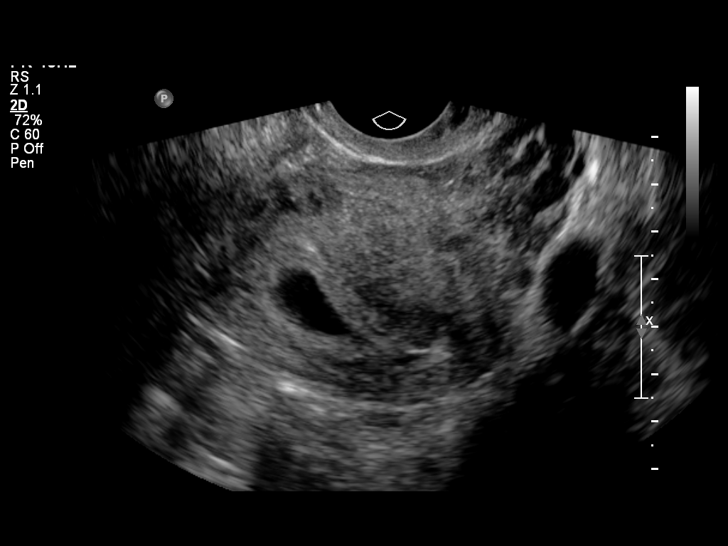
[im 23/68]
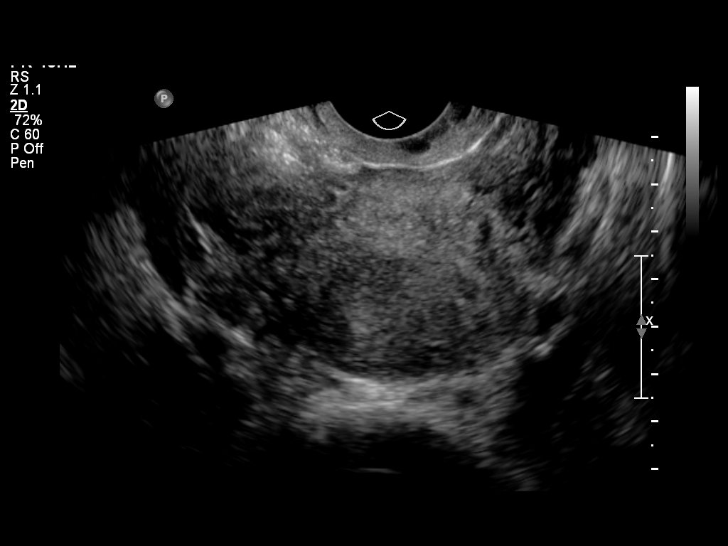
[im 28/68]
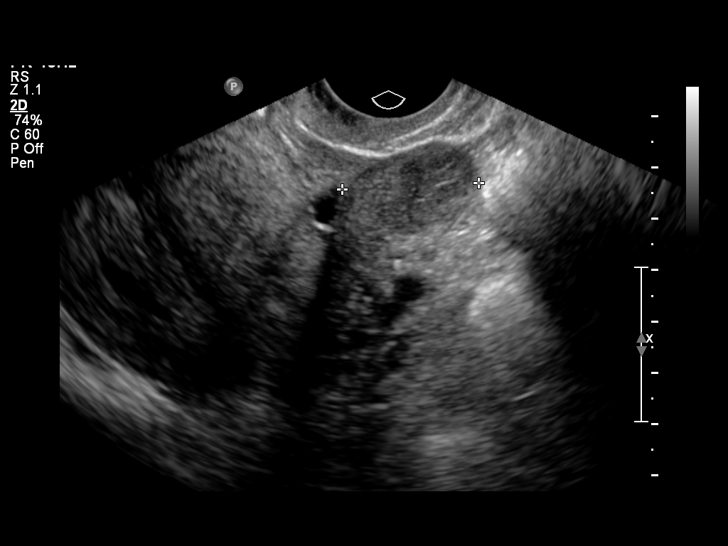
[im 35/68]
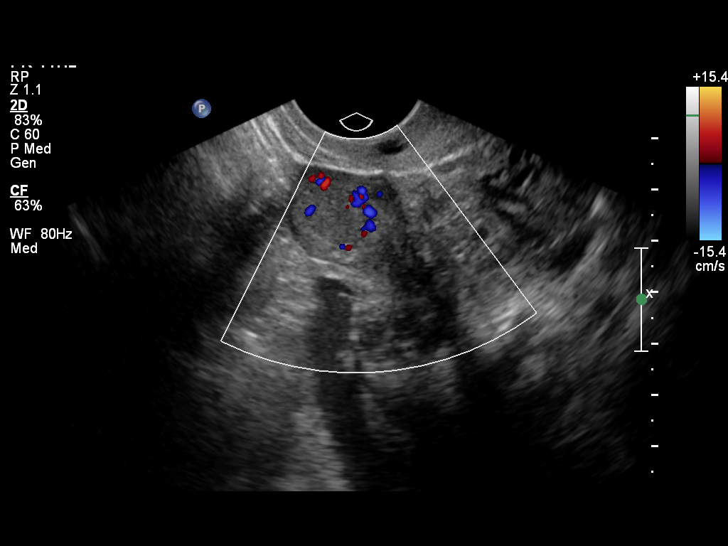
[im 40/68]
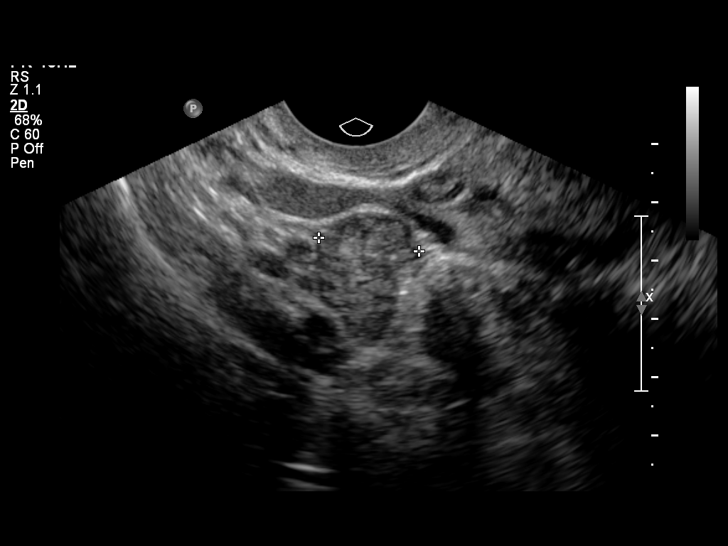
[im 45/68]
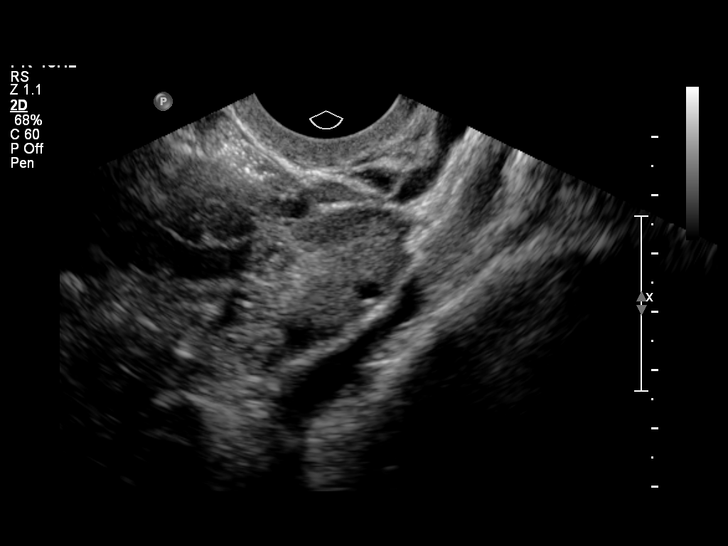
[im 50/68]
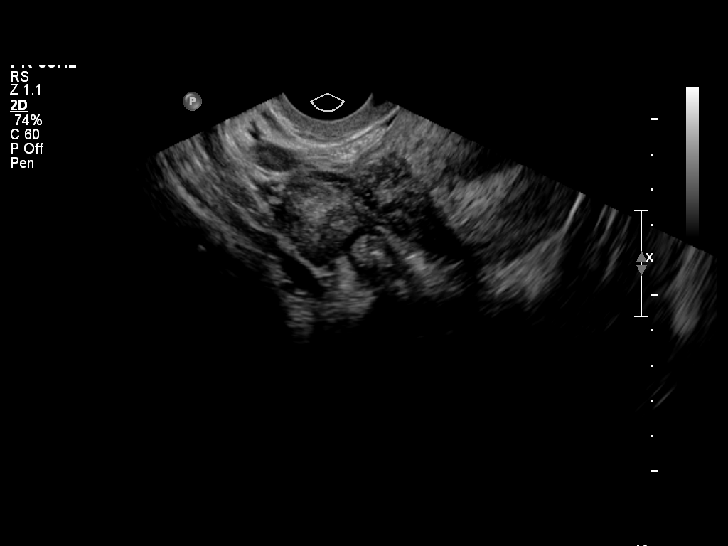
[im 55/68]
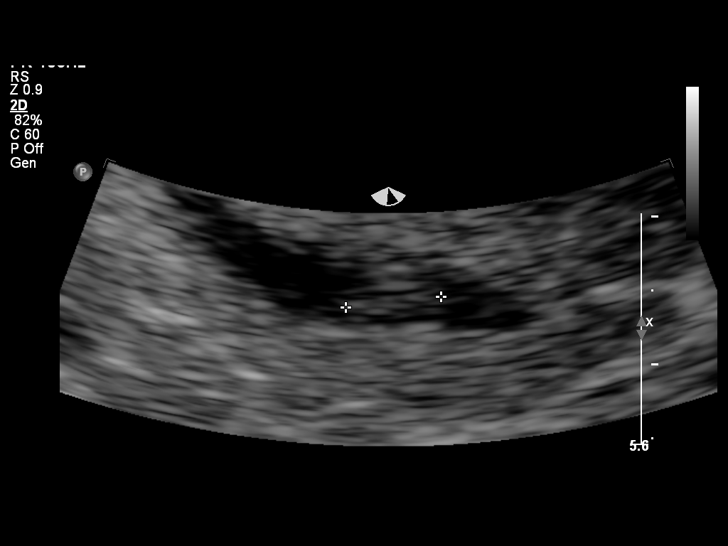
[im 60/68]
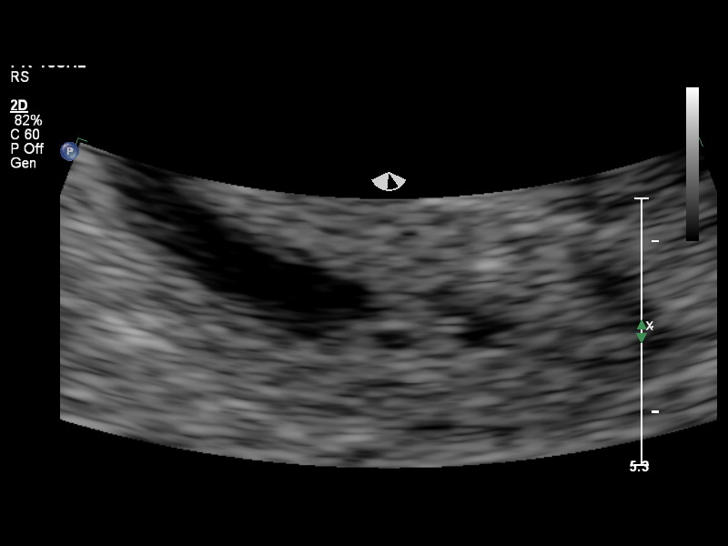
[im 65/68]
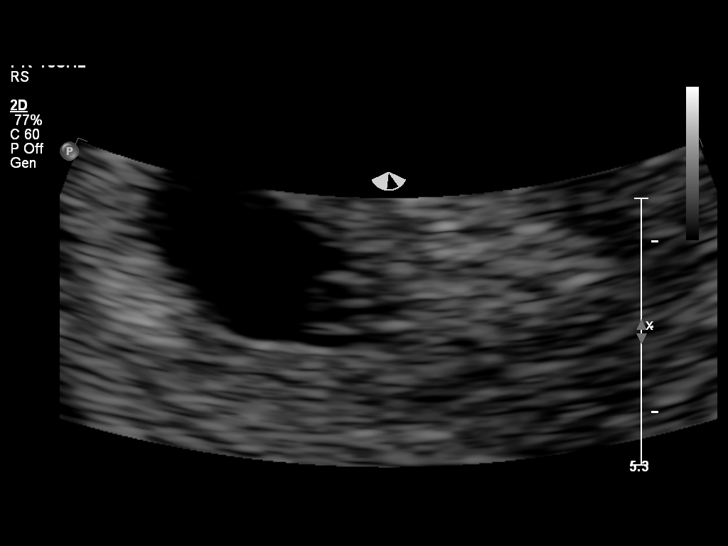

[13 of 28 positions shown; findings below may reference images not displayed]

FINDINGS: Intrauterine gestational sac: Visualized/normal in shape.

Yolk sac:  Yes

Embryo:  Yes

Cardiac Activity: Yes

Heart Rate: 125 bpm

CRL:   6.8 mm   6 w 4 d                  US EDC: 06/04/2015

Maternal uterus/adnexae: A small amount of subchorionic hemorrhage
is noted. The uterus is otherwise unremarkable.

The ovaries are within normal limits. The right ovary measures 3.9 x
1.8 x 1.7 cm, while the left ovary measures 4.2 x 1.9 x 2.7 cm. No
suspicious adnexal masses are seen; there is no evidence for
torsion.

Trace free fluid is seen within the pelvic cul-de-sac.
IMPRESSION: 1. Single live intrauterine pregnancy noted, with a crown-rump
length of 7 mm, corresponding to a gestational age of 6 weeks 4
days. This matches the gestational age of 5 weeks 4 days by LMP,
reflecting an estimated date of delivery June 10, 2015.
2. Small amount of subchorionic hemorrhage noted.
